# Patient Record
Sex: Female | Born: 1965 | Race: White | Hispanic: Yes | Marital: Married | Smoking: Never smoker
Health system: Southern US, Community
[De-identification: ages and names within clinical notes are randomized; demographics above are authoritative.]

## PROBLEM LIST (undated history)

## (undated) DIAGNOSIS — Z800 Family history of malignant neoplasm of digestive organs: Secondary | ICD-10-CM

## (undated) DIAGNOSIS — Z853 Personal history of malignant neoplasm of breast: Secondary | ICD-10-CM

## (undated) DIAGNOSIS — E785 Hyperlipidemia, unspecified: Secondary | ICD-10-CM

## (undated) DIAGNOSIS — Z803 Family history of malignant neoplasm of breast: Secondary | ICD-10-CM

## (undated) DIAGNOSIS — C801 Malignant (primary) neoplasm, unspecified: Secondary | ICD-10-CM

## (undated) HISTORY — DX: Malignant (primary) neoplasm, unspecified: C80.1

## (undated) HISTORY — DX: Hyperlipidemia, unspecified: E78.5

## (undated) HISTORY — DX: Personal history of malignant neoplasm of breast: Z85.3

## (undated) HISTORY — DX: Family history of malignant neoplasm of digestive organs: Z80.0

## (undated) HISTORY — DX: Family history of malignant neoplasm of breast: Z80.3

## (undated) HISTORY — PX: MASTECTOMY: SHX3

---
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
## 1998-11-21 ENCOUNTER — Other Ambulatory Visit: Admission: RE | Admit: 1998-11-21 | Discharge: 1998-11-21 | Payer: Self-pay | Admitting: Obstetrics and Gynecology

## 1999-12-03 ENCOUNTER — Other Ambulatory Visit: Admission: RE | Admit: 1999-12-03 | Discharge: 1999-12-03 | Payer: Self-pay | Admitting: Obstetrics and Gynecology

## 2000-12-02 ENCOUNTER — Other Ambulatory Visit: Admission: RE | Admit: 2000-12-02 | Discharge: 2000-12-02 | Payer: Self-pay | Admitting: Obstetrics and Gynecology

## 2001-06-22 HISTORY — PX: APPENDECTOMY: SHX54

## 2001-11-16 ENCOUNTER — Encounter (INDEPENDENT_AMBULATORY_CARE_PROVIDER_SITE_OTHER): Payer: Self-pay | Admitting: Specialist

## 2001-11-16 ENCOUNTER — Encounter: Payer: Self-pay | Admitting: Emergency Medicine

## 2001-11-17 ENCOUNTER — Inpatient Hospital Stay (HOSPITAL_COMMUNITY): Admission: EM | Admit: 2001-11-17 | Discharge: 2001-11-17 | Payer: Self-pay | Admitting: Emergency Medicine

## 2001-12-12 ENCOUNTER — Other Ambulatory Visit: Admission: RE | Admit: 2001-12-12 | Discharge: 2001-12-12 | Payer: Self-pay | Admitting: Obstetrics and Gynecology

## 2002-01-06 ENCOUNTER — Encounter: Payer: Self-pay | Admitting: Orthopedic Surgery

## 2002-01-06 ENCOUNTER — Ambulatory Visit (HOSPITAL_COMMUNITY): Admission: RE | Admit: 2002-01-06 | Discharge: 2002-01-06 | Payer: Self-pay | Admitting: Orthopedic Surgery

## 2002-07-05 ENCOUNTER — Other Ambulatory Visit: Admission: RE | Admit: 2002-07-05 | Discharge: 2002-07-05 | Payer: Self-pay | Admitting: Obstetrics and Gynecology

## 2002-12-11 ENCOUNTER — Inpatient Hospital Stay (HOSPITAL_COMMUNITY): Admission: AD | Admit: 2002-12-11 | Discharge: 2002-12-11 | Payer: Self-pay | Admitting: Obstetrics and Gynecology

## 2002-12-27 ENCOUNTER — Inpatient Hospital Stay (HOSPITAL_COMMUNITY): Admission: AD | Admit: 2002-12-27 | Discharge: 2002-12-30 | Payer: Self-pay | Admitting: Obstetrics and Gynecology

## 2002-12-31 ENCOUNTER — Inpatient Hospital Stay (HOSPITAL_COMMUNITY): Admission: AD | Admit: 2002-12-31 | Discharge: 2002-12-31 | Payer: Self-pay | Admitting: *Deleted

## 2003-02-27 ENCOUNTER — Other Ambulatory Visit: Admission: RE | Admit: 2003-02-27 | Discharge: 2003-02-27 | Payer: Self-pay | Admitting: Obstetrics and Gynecology

## 2004-05-26 ENCOUNTER — Encounter: Admission: RE | Admit: 2004-05-26 | Discharge: 2004-08-24 | Payer: Self-pay | Admitting: Obstetrics and Gynecology

## 2005-10-01 ENCOUNTER — Encounter: Admission: RE | Admit: 2005-10-01 | Discharge: 2005-10-01 | Payer: Self-pay | Admitting: Obstetrics and Gynecology

## 2005-10-01 ENCOUNTER — Encounter (INDEPENDENT_AMBULATORY_CARE_PROVIDER_SITE_OTHER): Payer: Self-pay | Admitting: Diagnostic Radiology

## 2005-10-01 ENCOUNTER — Encounter (INDEPENDENT_AMBULATORY_CARE_PROVIDER_SITE_OTHER): Payer: Self-pay | Admitting: *Deleted

## 2005-10-07 ENCOUNTER — Encounter: Admission: RE | Admit: 2005-10-07 | Discharge: 2005-10-07 | Payer: Self-pay | Admitting: General Surgery

## 2005-10-08 ENCOUNTER — Encounter: Admission: RE | Admit: 2005-10-08 | Discharge: 2005-10-08 | Payer: Self-pay | Admitting: Women's Health

## 2005-10-15 ENCOUNTER — Encounter (INDEPENDENT_AMBULATORY_CARE_PROVIDER_SITE_OTHER): Payer: Self-pay | Admitting: Specialist

## 2005-10-15 ENCOUNTER — Encounter: Admission: RE | Admit: 2005-10-15 | Discharge: 2005-10-15 | Payer: Self-pay | Admitting: General Surgery

## 2005-10-21 ENCOUNTER — Encounter: Payer: Self-pay | Admitting: Emergency Medicine

## 2005-10-29 ENCOUNTER — Ambulatory Visit: Payer: Self-pay | Admitting: Oncology

## 2005-10-29 ENCOUNTER — Encounter: Admission: RE | Admit: 2005-10-29 | Discharge: 2005-10-29 | Payer: Self-pay | Admitting: General Surgery

## 2005-10-29 ENCOUNTER — Ambulatory Visit (HOSPITAL_BASED_OUTPATIENT_CLINIC_OR_DEPARTMENT_OTHER): Admission: RE | Admit: 2005-10-29 | Discharge: 2005-10-29 | Payer: Self-pay | Admitting: General Surgery

## 2005-10-29 ENCOUNTER — Encounter (INDEPENDENT_AMBULATORY_CARE_PROVIDER_SITE_OTHER): Payer: Self-pay | Admitting: *Deleted

## 2005-11-11 ENCOUNTER — Encounter: Payer: Self-pay | Admitting: Family Medicine

## 2005-11-11 LAB — CBC WITH DIFFERENTIAL/PLATELET
Basophils Absolute: 0 10*3/uL (ref 0.0–0.1)
EOS%: 1.3 % (ref 0.0–7.0)
HCT: 39.3 % (ref 34.8–46.6)
HGB: 13.6 g/dL (ref 11.6–15.9)
LYMPH%: 20 % (ref 14.0–48.0)
MCH: 31.1 pg (ref 26.0–34.0)
MCHC: 34.6 g/dL (ref 32.0–36.0)
MCV: 89.8 fL (ref 81.0–101.0)
MONO%: 6.1 % (ref 0.0–13.0)
NEUT%: 72.4 % (ref 39.6–76.8)
Platelets: 252 10*3/uL (ref 145–400)
lymph#: 1 10*3/uL (ref 0.9–3.3)

## 2005-11-11 LAB — COMPREHENSIVE METABOLIC PANEL
ALT: 14 U/L (ref 0–40)
CO2: 28 mEq/L (ref 19–32)
Creatinine, Ser: 0.9 mg/dL (ref 0.4–1.2)
Total Bilirubin: 0.6 mg/dL (ref 0.3–1.2)

## 2005-11-11 LAB — LACTATE DEHYDROGENASE: LDH: 135 U/L (ref 94–250)

## 2005-11-17 ENCOUNTER — Encounter (INDEPENDENT_AMBULATORY_CARE_PROVIDER_SITE_OTHER): Payer: Self-pay | Admitting: *Deleted

## 2005-11-17 ENCOUNTER — Ambulatory Visit (HOSPITAL_BASED_OUTPATIENT_CLINIC_OR_DEPARTMENT_OTHER): Admission: RE | Admit: 2005-11-17 | Discharge: 2005-11-17 | Payer: Self-pay | Admitting: General Surgery

## 2005-11-25 ENCOUNTER — Ambulatory Visit (HOSPITAL_COMMUNITY): Admission: RE | Admit: 2005-11-25 | Discharge: 2005-11-25 | Payer: Self-pay | Admitting: Oncology

## 2005-12-02 ENCOUNTER — Encounter (INDEPENDENT_AMBULATORY_CARE_PROVIDER_SITE_OTHER): Payer: Self-pay | Admitting: Specialist

## 2005-12-02 ENCOUNTER — Ambulatory Visit (HOSPITAL_BASED_OUTPATIENT_CLINIC_OR_DEPARTMENT_OTHER): Admission: RE | Admit: 2005-12-02 | Discharge: 2005-12-03 | Payer: Self-pay | Admitting: General Surgery

## 2005-12-09 ENCOUNTER — Ambulatory Visit: Payer: Self-pay | Admitting: Oncology

## 2005-12-16 ENCOUNTER — Encounter: Admission: RE | Admit: 2005-12-16 | Discharge: 2005-12-16 | Payer: Self-pay | Admitting: Oncology

## 2006-01-06 LAB — CBC WITH DIFFERENTIAL/PLATELET
BASO%: 0.3 % (ref 0.0–2.0)
Eosinophils Absolute: 0.2 10*3/uL (ref 0.0–0.5)
LYMPH%: 19.8 % (ref 14.0–48.0)
MCHC: 34.6 g/dL (ref 32.0–36.0)
MCV: 90.1 fL (ref 81.0–101.0)
MONO#: 0.4 10*3/uL (ref 0.1–0.9)
MONO%: 8.6 % (ref 0.0–13.0)
NEUT#: 3 10*3/uL (ref 1.5–6.5)
Platelets: 242 10*3/uL (ref 145–400)
RBC: 3.99 10*6/uL (ref 3.70–5.32)
RDW: 12.3 % (ref 11.3–14.5)
WBC: 4.4 10*3/uL (ref 3.9–10.0)

## 2006-01-06 LAB — BASIC METABOLIC PANEL
Calcium: 9 mg/dL (ref 8.4–10.5)
Glucose, Bld: 82 mg/dL (ref 70–99)
Potassium: 4.1 mEq/L (ref 3.5–5.3)
Sodium: 139 mEq/L (ref 135–145)

## 2006-01-14 LAB — CBC WITH DIFFERENTIAL/PLATELET
Basophils Absolute: 0.5 10*3/uL — ABNORMAL HIGH (ref 0.0–0.1)
Eosinophils Absolute: 0.1 10*3/uL (ref 0.0–0.5)
HCT: 40.5 % (ref 34.8–46.6)
HGB: 14.3 g/dL (ref 11.6–15.9)
LYMPH%: 7.2 % — ABNORMAL LOW (ref 14.0–48.0)
MONO#: 1.1 10*3/uL — ABNORMAL HIGH (ref 0.1–0.9)
NEUT%: 86.4 % — ABNORMAL HIGH (ref 39.6–76.8)
Platelets: 230 10*3/uL (ref 145–400)
WBC: 26.5 10*3/uL — ABNORMAL HIGH (ref 3.9–10.0)
lymph#: 1.9 10*3/uL (ref 0.9–3.3)

## 2006-01-25 ENCOUNTER — Ambulatory Visit: Payer: Self-pay | Admitting: Oncology

## 2006-01-27 LAB — COMPREHENSIVE METABOLIC PANEL
ALT: 38 U/L (ref 0–40)
Albumin: 4.7 g/dL (ref 3.5–5.2)
CO2: 26 mEq/L (ref 19–32)
Glucose, Bld: 139 mg/dL — ABNORMAL HIGH (ref 70–99)
Potassium: 4.3 mEq/L (ref 3.5–5.3)
Sodium: 139 mEq/L (ref 135–145)
Total Protein: 7 g/dL (ref 6.0–8.3)

## 2006-01-27 LAB — CBC WITH DIFFERENTIAL/PLATELET
Basophils Absolute: 0 10*3/uL (ref 0.0–0.1)
HCT: 37 % (ref 34.8–46.6)
HGB: 12.8 g/dL (ref 11.6–15.9)
MCH: 31.2 pg (ref 26.0–34.0)
MONO#: 0 10*3/uL — ABNORMAL LOW (ref 0.1–0.9)
NEUT%: 95.7 % — ABNORMAL HIGH (ref 39.6–76.8)
WBC: 8.1 10*3/uL (ref 3.9–10.0)
lymph#: 0.3 10*3/uL — ABNORMAL LOW (ref 0.9–3.3)

## 2006-02-04 LAB — CBC WITH DIFFERENTIAL/PLATELET
BASO%: 1.1 % (ref 0.0–2.0)
Eosinophils Absolute: 0 10*3/uL (ref 0.0–0.5)
MCHC: 34.8 g/dL (ref 32.0–36.0)
MCV: 88.5 fL (ref 81.0–101.0)
MONO#: 0.4 10*3/uL (ref 0.1–0.9)
MONO%: 11.7 % (ref 0.0–13.0)
NEUT#: 2.2 10*3/uL (ref 1.5–6.5)
RBC: 4.11 10*6/uL (ref 3.70–5.32)
RDW: 13.1 % (ref 11.3–14.5)
WBC: 3.5 10*3/uL — ABNORMAL LOW (ref 3.9–10.0)

## 2006-02-17 LAB — CBC WITH DIFFERENTIAL/PLATELET
BASO%: 0.6 % (ref 0.0–2.0)
Eosinophils Absolute: 0 10*3/uL (ref 0.0–0.5)
LYMPH%: 4.6 % — ABNORMAL LOW (ref 14.0–48.0)
MCHC: 34 g/dL (ref 32.0–36.0)
MONO#: 0.1 10*3/uL (ref 0.1–0.9)
NEUT#: 5.4 10*3/uL (ref 1.5–6.5)
Platelets: 302 10*3/uL (ref 145–400)
RBC: 4.32 10*6/uL (ref 3.70–5.32)
WBC: 5.8 10*3/uL (ref 3.9–10.0)
lymph#: 0.3 10*3/uL — ABNORMAL LOW (ref 0.9–3.3)

## 2006-02-25 LAB — CBC WITH DIFFERENTIAL/PLATELET
BASO%: 2.6 % — ABNORMAL HIGH (ref 0.0–2.0)
Basophils Absolute: 0 10*3/uL (ref 0.0–0.1)
Eosinophils Absolute: 0 10*3/uL (ref 0.0–0.5)
HCT: 33.7 % — ABNORMAL LOW (ref 34.8–46.6)
HGB: 12 g/dL (ref 11.6–15.9)
LYMPH%: 38.7 % (ref 14.0–48.0)
MCHC: 35.4 g/dL (ref 32.0–36.0)
MONO#: 0.1 10*3/uL (ref 0.1–0.9)
NEUT#: 0.7 10*3/uL — ABNORMAL LOW (ref 1.5–6.5)
NEUT%: 47.5 % (ref 39.6–76.8)
Platelets: 231 10*3/uL (ref 145–400)
WBC: 1.4 10*3/uL — ABNORMAL LOW (ref 3.9–10.0)
lymph#: 0.5 10*3/uL — ABNORMAL LOW (ref 0.9–3.3)

## 2006-03-16 ENCOUNTER — Ambulatory Visit: Payer: Self-pay | Admitting: Oncology

## 2006-03-18 LAB — CBC WITH DIFFERENTIAL/PLATELET
BASO%: 0.2 % (ref 0.0–2.0)
Eosinophils Absolute: 0 10*3/uL (ref 0.0–0.5)
HCT: 37.8 % (ref 34.8–46.6)
MCHC: 34.9 g/dL (ref 32.0–36.0)
MONO#: 0.5 10*3/uL (ref 0.1–0.9)
NEUT#: 6 10*3/uL (ref 1.5–6.5)
NEUT%: 85.4 % — ABNORMAL HIGH (ref 39.6–76.8)
Platelets: 285 10*3/uL (ref 145–400)
RBC: 4.34 10*6/uL (ref 3.70–5.32)
WBC: 7 10*3/uL (ref 3.9–10.0)
lymph#: 0.5 10*3/uL — ABNORMAL LOW (ref 0.9–3.3)

## 2006-03-18 LAB — COMPREHENSIVE METABOLIC PANEL
ALT: 27 U/L (ref 0–40)
BUN: 16 mg/dL (ref 6–23)
CO2: 23 mEq/L (ref 19–32)
Calcium: 9.2 mg/dL (ref 8.4–10.5)
Chloride: 105 mEq/L (ref 96–112)
Creatinine, Ser: 0.75 mg/dL (ref 0.40–1.20)
Glucose, Bld: 115 mg/dL — ABNORMAL HIGH (ref 70–99)
Total Bilirubin: 0.4 mg/dL (ref 0.3–1.2)

## 2006-03-26 LAB — CBC WITH DIFFERENTIAL/PLATELET
BASO%: 3 % — ABNORMAL HIGH (ref 0.0–2.0)
LYMPH%: 56.5 % — ABNORMAL HIGH (ref 14.0–48.0)
MCHC: 36.2 g/dL — ABNORMAL HIGH (ref 32.0–36.0)
MONO#: 0.1 10*3/uL (ref 0.1–0.9)
RBC: 4.11 10*6/uL (ref 3.70–5.32)
RDW: 11.3 % (ref 11.3–14.5)
WBC: 0.8 10*3/uL — CL (ref 3.9–10.0)
lymph#: 0.4 10*3/uL — ABNORMAL LOW (ref 0.9–3.3)

## 2006-04-02 LAB — CBC WITH DIFFERENTIAL/PLATELET
BASO%: 3.1 % — ABNORMAL HIGH (ref 0.0–2.0)
HCT: 39.1 % (ref 34.8–46.6)
MCHC: 35.1 g/dL (ref 32.0–36.0)
MONO#: 0.6 10*3/uL (ref 0.1–0.9)
NEUT#: 1.5 10*3/uL (ref 1.5–6.5)
NEUT%: 51.4 % (ref 39.6–76.8)
WBC: 2.9 10*3/uL — ABNORMAL LOW (ref 3.9–10.0)
lymph#: 0.7 10*3/uL — ABNORMAL LOW (ref 0.9–3.3)

## 2006-04-23 LAB — CBC WITH DIFFERENTIAL/PLATELET
BASO%: 0.7 % (ref 0.0–2.0)
LYMPH%: 33.4 % (ref 14.0–48.0)
MCHC: 34.3 g/dL (ref 32.0–36.0)
MONO#: 0.3 10*3/uL (ref 0.1–0.9)
MONO%: 12.3 % (ref 0.0–13.0)
Platelets: 232 10*3/uL (ref 145–400)
RBC: 4.01 10*6/uL (ref 3.70–5.32)
RDW: 14.3 % (ref 11.3–14.5)
WBC: 2.2 10*3/uL — ABNORMAL LOW (ref 3.9–10.0)

## 2006-04-23 LAB — COMPREHENSIVE METABOLIC PANEL
ALT: 12 U/L (ref 0–35)
Alkaline Phosphatase: 42 U/L (ref 39–117)
CO2: 29 mEq/L (ref 19–32)
Potassium: 4.4 mEq/L (ref 3.5–5.3)
Sodium: 140 mEq/L (ref 135–145)
Total Bilirubin: 0.5 mg/dL (ref 0.3–1.2)
Total Protein: 6 g/dL (ref 6.0–8.3)

## 2006-04-28 LAB — CBC WITH DIFFERENTIAL/PLATELET
Basophils Absolute: 0.1 10*3/uL (ref 0.0–0.1)
EOS%: 1.7 % (ref 0.0–7.0)
Eosinophils Absolute: 0.1 10*3/uL (ref 0.0–0.5)
HGB: 13 g/dL (ref 11.6–15.9)
LYMPH%: 24.2 % (ref 14.0–48.0)
MCH: 30.7 pg (ref 26.0–34.0)
MCV: 87.5 fL (ref 81.0–101.0)
MONO%: 11.1 % (ref 0.0–13.0)
NEUT#: 1.9 10*3/uL (ref 1.5–6.5)
Platelets: 212 10*3/uL (ref 145–400)
RBC: 4.23 10*6/uL (ref 3.70–5.32)

## 2006-05-05 ENCOUNTER — Ambulatory Visit: Payer: Self-pay | Admitting: Oncology

## 2006-05-07 LAB — CBC WITH DIFFERENTIAL/PLATELET
BASO%: 1.6 % (ref 0.0–2.0)
EOS%: 1.8 % (ref 0.0–7.0)
LYMPH%: 32.1 % (ref 14.0–48.0)
MCHC: 34.9 g/dL (ref 32.0–36.0)
MCV: 89.3 fL (ref 81.0–101.0)
MONO%: 9.8 % (ref 0.0–13.0)
Platelets: 201 10*3/uL (ref 145–400)
RBC: 4.41 10*6/uL (ref 3.70–5.32)

## 2006-05-10 ENCOUNTER — Ambulatory Visit (HOSPITAL_BASED_OUTPATIENT_CLINIC_OR_DEPARTMENT_OTHER): Admission: RE | Admit: 2006-05-10 | Discharge: 2006-05-10 | Payer: Self-pay | Admitting: *Deleted

## 2006-05-28 LAB — CBC WITH DIFFERENTIAL/PLATELET
BASO%: 2.8 % — ABNORMAL HIGH (ref 0.0–2.0)
EOS%: 1.7 % (ref 0.0–7.0)
Eosinophils Absolute: 0.1 10*3/uL (ref 0.0–0.5)
LYMPH%: 17.4 % (ref 14.0–48.0)
MCH: 30.4 pg (ref 26.0–34.0)
MCHC: 34.4 g/dL (ref 32.0–36.0)
MCV: 88.3 fL (ref 81.0–101.0)
MONO%: 15.1 % — ABNORMAL HIGH (ref 0.0–13.0)
Platelets: 216 10*3/uL (ref 145–400)
RBC: 4.47 10*6/uL (ref 3.70–5.32)
RDW: 11.4 % (ref 11.3–14.5)

## 2006-07-20 ENCOUNTER — Ambulatory Visit: Payer: Self-pay | Admitting: Oncology

## 2006-07-23 LAB — COMPREHENSIVE METABOLIC PANEL
BUN: 15 mg/dL (ref 6–23)
CO2: 27 mEq/L (ref 19–32)
Calcium: 9 mg/dL (ref 8.4–10.5)
Chloride: 106 mEq/L (ref 96–112)
Creatinine, Ser: 0.96 mg/dL (ref 0.40–1.20)

## 2006-07-23 LAB — CBC WITH DIFFERENTIAL/PLATELET
Basophils Absolute: 0 10*3/uL (ref 0.0–0.1)
Eosinophils Absolute: 0 10*3/uL (ref 0.0–0.5)
HCT: 39 % (ref 34.8–46.6)
HGB: 13.8 g/dL (ref 11.6–15.9)
MONO#: 0.3 10*3/uL (ref 0.1–0.9)
NEUT%: 70.2 % (ref 39.6–76.8)
WBC: 3.4 10*3/uL — ABNORMAL LOW (ref 3.9–10.0)
lymph#: 0.7 10*3/uL — ABNORMAL LOW (ref 0.9–3.3)

## 2006-07-23 LAB — CANCER ANTIGEN 27.29: CA 27.29: 18 U/mL (ref 0–39)

## 2006-10-07 ENCOUNTER — Encounter: Admission: RE | Admit: 2006-10-07 | Discharge: 2006-10-07 | Payer: Self-pay | Admitting: General Surgery

## 2006-10-18 ENCOUNTER — Ambulatory Visit (HOSPITAL_COMMUNITY): Admission: RE | Admit: 2006-10-18 | Discharge: 2006-10-18 | Payer: Self-pay | Admitting: Oncology

## 2006-10-19 ENCOUNTER — Ambulatory Visit: Payer: Self-pay | Admitting: Oncology

## 2006-10-22 LAB — CBC WITH DIFFERENTIAL/PLATELET
Basophils Absolute: 0 10*3/uL (ref 0.0–0.1)
EOS%: 0.4 % (ref 0.0–7.0)
Eosinophils Absolute: 0 10*3/uL (ref 0.0–0.5)
HGB: 13.4 g/dL (ref 11.6–15.9)
MCH: 31.6 pg (ref 26.0–34.0)
NEUT#: 2.4 10*3/uL (ref 1.5–6.5)
RDW: 12 % (ref 11.3–14.5)
lymph#: 1 10*3/uL (ref 0.9–3.3)

## 2006-10-22 LAB — COMPREHENSIVE METABOLIC PANEL
ALT: 14 U/L (ref 0–35)
AST: 20 U/L (ref 0–37)
Alkaline Phosphatase: 45 U/L (ref 39–117)
Creatinine, Ser: 0.91 mg/dL (ref 0.40–1.20)
Sodium: 142 mEq/L (ref 135–145)
Total Bilirubin: 0.5 mg/dL (ref 0.3–1.2)
Total Protein: 6.3 g/dL (ref 6.0–8.3)

## 2006-10-22 LAB — LACTATE DEHYDROGENASE: LDH: 150 U/L (ref 94–250)

## 2006-10-23 ENCOUNTER — Ambulatory Visit (HOSPITAL_COMMUNITY): Admission: RE | Admit: 2006-10-23 | Discharge: 2006-10-23 | Payer: Self-pay | Admitting: Oncology

## 2007-02-23 ENCOUNTER — Ambulatory Visit: Payer: Self-pay | Admitting: Oncology

## 2007-02-25 LAB — CBC WITH DIFFERENTIAL/PLATELET
Basophils Absolute: 0 10*3/uL (ref 0.0–0.1)
Eosinophils Absolute: 0 10*3/uL (ref 0.0–0.5)
HCT: 36 % (ref 34.8–46.6)
HGB: 12.9 g/dL (ref 11.6–15.9)
MCV: 89.3 fL (ref 81.0–101.0)
MONO%: 8.6 % (ref 0.0–13.0)
NEUT#: 2.5 10*3/uL (ref 1.5–6.5)
NEUT%: 66.1 % (ref 39.6–76.8)
Platelets: 199 10*3/uL (ref 145–400)
RDW: 12.3 % (ref 11.3–14.5)

## 2007-02-25 LAB — COMPREHENSIVE METABOLIC PANEL
Albumin: 4.3 g/dL (ref 3.5–5.2)
Alkaline Phosphatase: 41 U/L (ref 39–117)
BUN: 17 mg/dL (ref 6–23)
Calcium: 8.8 mg/dL (ref 8.4–10.5)
Creatinine, Ser: 0.96 mg/dL (ref 0.40–1.20)
Glucose, Bld: 79 mg/dL (ref 70–99)
Potassium: 4 mEq/L (ref 3.5–5.3)

## 2007-03-31 ENCOUNTER — Encounter: Admission: RE | Admit: 2007-03-31 | Discharge: 2007-03-31 | Payer: Self-pay | Admitting: Surgery

## 2007-04-15 ENCOUNTER — Encounter: Admission: RE | Admit: 2007-04-15 | Discharge: 2007-04-15 | Payer: Self-pay | Admitting: Oncology

## 2007-06-22 ENCOUNTER — Ambulatory Visit: Payer: Self-pay | Admitting: Oncology

## 2007-07-08 LAB — CBC WITH DIFFERENTIAL/PLATELET
BASO%: 2.2 % — ABNORMAL HIGH (ref 0.0–2.0)
EOS%: 1.2 % (ref 0.0–7.0)
LYMPH%: 36.6 % (ref 14.0–48.0)
MCH: 30.8 pg (ref 26.0–34.0)
MCHC: 34.4 g/dL (ref 32.0–36.0)
MONO#: 0.2 10*3/uL (ref 0.1–0.9)
MONO%: 6.5 % (ref 0.0–13.0)
NEUT%: 53.5 % (ref 39.6–76.8)
Platelets: 216 10*3/uL (ref 145–400)
RBC: 4.43 10*6/uL (ref 3.70–5.32)
WBC: 3.1 10*3/uL — ABNORMAL LOW (ref 3.9–10.0)

## 2007-07-08 LAB — COMPREHENSIVE METABOLIC PANEL
ALT: 13 U/L (ref 0–35)
AST: 23 U/L (ref 0–37)
Alkaline Phosphatase: 47 U/L (ref 39–117)
CO2: 27 mEq/L (ref 19–32)
Creatinine, Ser: 0.98 mg/dL (ref 0.40–1.20)
Sodium: 141 mEq/L (ref 135–145)
Total Bilirubin: 0.5 mg/dL (ref 0.3–1.2)
Total Protein: 6.8 g/dL (ref 6.0–8.3)

## 2007-11-02 ENCOUNTER — Ambulatory Visit: Payer: Self-pay | Admitting: Oncology

## 2007-11-04 LAB — CBC WITH DIFFERENTIAL/PLATELET
BASO%: 0.4 % (ref 0.0–2.0)
Basophils Absolute: 0 10*3/uL (ref 0.0–0.1)
EOS%: 0.8 % (ref 0.0–7.0)
Eosinophils Absolute: 0 10*3/uL (ref 0.0–0.5)
HCT: 37.5 % (ref 34.8–46.6)
HGB: 13.2 g/dL (ref 11.6–15.9)
LYMPH%: 36.3 % (ref 14.0–48.0)
MCH: 31.2 pg (ref 26.0–34.0)
MCHC: 35.1 g/dL (ref 32.0–36.0)
MCV: 88.8 fL (ref 81.0–101.0)
MONO#: 0.3 10*3/uL (ref 0.1–0.9)
MONO%: 9 % (ref 0.0–13.0)
NEUT#: 1.9 10*3/uL (ref 1.5–6.5)
NEUT%: 53.5 % (ref 39.6–76.8)
Platelets: 210 10*3/uL (ref 145–400)
RBC: 4.22 10*6/uL (ref 3.70–5.32)
RDW: 11.8 % (ref 11.3–14.5)
WBC: 3.5 10*3/uL — ABNORMAL LOW (ref 3.9–10.0)
lymph#: 1.3 10*3/uL (ref 0.9–3.3)

## 2007-11-04 LAB — COMPREHENSIVE METABOLIC PANEL
Alkaline Phosphatase: 50 U/L (ref 39–117)
CO2: 25 mEq/L (ref 19–32)
Creatinine, Ser: 0.95 mg/dL (ref 0.40–1.20)
Glucose, Bld: 77 mg/dL (ref 70–99)
Sodium: 141 mEq/L (ref 135–145)
Total Bilirubin: 0.4 mg/dL (ref 0.3–1.2)

## 2007-11-04 LAB — LACTATE DEHYDROGENASE: LDH: 166 U/L (ref 94–250)

## 2007-12-28 ENCOUNTER — Encounter: Admission: RE | Admit: 2007-12-28 | Discharge: 2007-12-28 | Payer: Self-pay | Admitting: Sports Medicine

## 2008-04-09 ENCOUNTER — Encounter: Admission: RE | Admit: 2008-04-09 | Discharge: 2008-04-09 | Payer: Self-pay | Admitting: Oncology

## 2008-04-18 ENCOUNTER — Encounter: Admission: RE | Admit: 2008-04-18 | Discharge: 2008-04-18 | Payer: Self-pay | Admitting: Oncology

## 2008-05-18 ENCOUNTER — Ambulatory Visit: Payer: Self-pay | Admitting: Oncology

## 2008-05-22 LAB — CBC WITH DIFFERENTIAL/PLATELET
BASO%: 0.5 % (ref 0.0–2.0)
LYMPH%: 37 % (ref 14.0–48.0)
MCHC: 35.2 g/dL (ref 32.0–36.0)
MONO#: 0.3 10*3/uL (ref 0.1–0.9)
Platelets: 215 10*3/uL (ref 145–400)
RBC: 4.24 10*6/uL (ref 3.70–5.32)
WBC: 3.4 10*3/uL — ABNORMAL LOW (ref 3.9–10.0)

## 2008-05-22 LAB — FOLLICLE STIMULATING HORMONE: FSH: 44.1 m[IU]/mL

## 2008-05-23 LAB — COMPREHENSIVE METABOLIC PANEL
ALT: 16 U/L (ref 0–35)
Alkaline Phosphatase: 54 U/L (ref 39–117)
CO2: 26 mEq/L (ref 19–32)
Sodium: 140 mEq/L (ref 135–145)
Total Bilirubin: 0.6 mg/dL (ref 0.3–1.2)
Total Protein: 7 g/dL (ref 6.0–8.3)

## 2008-10-02 ENCOUNTER — Encounter: Admission: RE | Admit: 2008-10-02 | Discharge: 2008-10-02 | Payer: Self-pay | Admitting: Surgery

## 2008-10-09 ENCOUNTER — Encounter: Admission: RE | Admit: 2008-10-09 | Discharge: 2008-10-09 | Payer: Self-pay | Admitting: Surgery

## 2008-10-17 LAB — HM MAMMOGRAPHY: HM Mammogram: NORMAL

## 2008-10-25 ENCOUNTER — Ambulatory Visit: Payer: Self-pay | Admitting: Family Medicine

## 2008-10-25 DIAGNOSIS — C801 Malignant (primary) neoplasm, unspecified: Secondary | ICD-10-CM

## 2008-10-25 DIAGNOSIS — E785 Hyperlipidemia, unspecified: Secondary | ICD-10-CM

## 2008-10-25 DIAGNOSIS — Z853 Personal history of malignant neoplasm of breast: Secondary | ICD-10-CM | POA: Insufficient documentation

## 2008-10-25 HISTORY — DX: Hyperlipidemia, unspecified: E78.5

## 2008-10-25 HISTORY — DX: Malignant (primary) neoplasm, unspecified: C80.1

## 2008-10-25 HISTORY — DX: Personal history of malignant neoplasm of breast: Z85.3

## 2008-10-26 LAB — CONVERTED CEMR LAB
ALT: 19 units/L (ref 0–35)
AST: 29 units/L (ref 0–37)
Albumin: 4.1 g/dL (ref 3.5–5.2)
Alkaline Phosphatase: 41 units/L (ref 39–117)
Basophils Relative: 0.8 % (ref 0.0–3.0)
Bilirubin, Direct: 0 mg/dL (ref 0.0–0.3)
Calcium: 9 mg/dL (ref 8.4–10.5)
Creatinine, Ser: 1 mg/dL (ref 0.4–1.2)
Eosinophils Absolute: 0 10*3/uL (ref 0.0–0.7)
Eosinophils Relative: 0.5 % (ref 0.0–5.0)
GFR calc non Af Amer: 64.09 mL/min (ref >60)
HDL: 39.3 mg/dL (ref >39.00)
Hemoglobin: 13.2 g/dL (ref 12.0–15.0)
LDL Cholesterol: 113 mg/dL — ABNORMAL HIGH (ref 0–99)
Lymphocytes Relative: 22.6 % (ref 12.0–46.0)
Monocytes Relative: 6.1 % (ref 3.0–12.0)
Neutro Abs: 4.2 10*3/uL (ref 1.4–7.7)
Neutrophils Relative %: 70 % (ref 43.0–77.0)
RBC: 4.08 M/uL (ref 3.87–5.11)
Sodium: 145 meq/L (ref 135–145)
Total CHOL/HDL Ratio: 4
Total Protein: 6.7 g/dL (ref 6.0–8.3)
Triglycerides: 81 mg/dL (ref 0.0–149.0)
WBC: 6 10*3/uL (ref 4.5–10.5)

## 2008-11-08 ENCOUNTER — Ambulatory Visit: Payer: Self-pay | Admitting: Family Medicine

## 2008-11-20 ENCOUNTER — Ambulatory Visit: Payer: Self-pay | Admitting: Oncology

## 2008-11-29 LAB — CBC WITH DIFFERENTIAL/PLATELET
BASO%: 0.4 % (ref 0.0–2.0)
Basophils Absolute: 0 10*3/uL (ref 0.0–0.1)
EOS%: 0.8 % (ref 0.0–7.0)
Eosinophils Absolute: 0 10*3/uL (ref 0.0–0.5)
HCT: 37.9 % (ref 34.8–46.6)
HGB: 13.1 g/dL (ref 11.6–15.9)
LYMPH%: 31.3 % (ref 14.0–49.7)
MCH: 30.4 pg (ref 25.1–34.0)
MCHC: 34.6 g/dL (ref 31.5–36.0)
MCV: 87.9 fL (ref 79.5–101.0)
MONO#: 0.4 10*3/uL (ref 0.1–0.9)
MONO%: 8.1 % (ref 0.0–14.0)
NEUT#: 2.9 10*3/uL (ref 1.5–6.5)
NEUT%: 59.4 % (ref 38.4–76.8)
Platelets: 202 10*3/uL (ref 145–400)
RBC: 4.31 10*6/uL (ref 3.70–5.45)
RDW: 12.1 % (ref 11.2–14.5)
WBC: 4.8 10*3/uL (ref 3.9–10.3)
lymph#: 1.5 10*3/uL (ref 0.9–3.3)
nRBC: 0 % (ref 0–0)

## 2008-11-30 LAB — COMPREHENSIVE METABOLIC PANEL
Albumin: 4.2 g/dL (ref 3.5–5.2)
Alkaline Phosphatase: 41 U/L (ref 39–117)
BUN: 14 mg/dL (ref 6–23)
Creatinine, Ser: 0.99 mg/dL (ref 0.40–1.20)
Glucose, Bld: 99 mg/dL (ref 70–99)
Total Bilirubin: 0.4 mg/dL (ref 0.3–1.2)

## 2008-11-30 LAB — VITAMIN D 25 HYDROXY (VIT D DEFICIENCY, FRACTURES): Vit D, 25-Hydroxy: 45 ng/mL (ref 30–89)

## 2008-11-30 LAB — FOLLICLE STIMULATING HORMONE: FSH: 35.5 m[IU]/mL

## 2008-11-30 LAB — CANCER ANTIGEN 27.29: CA 27.29: 19 U/mL (ref 0–39)

## 2008-12-05 LAB — ESTRADIOL, ULTRA SENS

## 2009-04-16 ENCOUNTER — Encounter (INDEPENDENT_AMBULATORY_CARE_PROVIDER_SITE_OTHER): Payer: Self-pay | Admitting: Surgery

## 2009-04-16 ENCOUNTER — Ambulatory Visit (HOSPITAL_COMMUNITY): Admission: RE | Admit: 2009-04-16 | Discharge: 2009-04-17 | Payer: Self-pay | Admitting: Surgery

## 2009-05-23 ENCOUNTER — Encounter: Admission: RE | Admit: 2009-05-23 | Discharge: 2009-05-23 | Payer: Self-pay | Admitting: Obstetrics and Gynecology

## 2009-05-31 ENCOUNTER — Ambulatory Visit: Payer: Self-pay | Admitting: Oncology

## 2009-06-04 LAB — CBC WITH DIFFERENTIAL/PLATELET
BASO%: 0.5 % (ref 0.0–2.0)
LYMPH%: 28.8 % (ref 14.0–49.7)
MCHC: 34.3 g/dL (ref 31.5–36.0)
MONO#: 0.2 10*3/uL (ref 0.1–0.9)
RBC: 4.21 10*6/uL (ref 3.70–5.45)
WBC: 3.8 10*3/uL — ABNORMAL LOW (ref 3.9–10.3)
lymph#: 1.1 10*3/uL (ref 0.9–3.3)

## 2009-06-05 LAB — COMPREHENSIVE METABOLIC PANEL
ALT: 15 U/L (ref 0–35)
AST: 19 U/L (ref 0–37)
BUN: 13 mg/dL (ref 6–23)
CO2: 28 mEq/L (ref 19–32)
Creatinine, Ser: 0.91 mg/dL (ref 0.40–1.20)
Total Bilirubin: 0.5 mg/dL (ref 0.3–1.2)

## 2009-06-05 LAB — FOLLICLE STIMULATING HORMONE: FSH: 58.2 m[IU]/mL

## 2009-06-05 LAB — LACTATE DEHYDROGENASE: LDH: 169 U/L (ref 94–250)

## 2009-06-05 LAB — CANCER ANTIGEN 27.29: CA 27.29: 17 U/mL (ref 0–39)

## 2009-06-11 ENCOUNTER — Encounter: Payer: Self-pay | Admitting: Family Medicine

## 2009-06-18 LAB — ESTRADIOL, ULTRA SENS: Estradiol, Ultra Sensitive: 24 pg/mL

## 2009-12-04 ENCOUNTER — Ambulatory Visit: Payer: Self-pay | Admitting: Oncology

## 2009-12-06 LAB — COMPREHENSIVE METABOLIC PANEL
Alkaline Phosphatase: 38 U/L — ABNORMAL LOW (ref 39–117)
CO2: 23 mEq/L (ref 19–32)
Creatinine, Ser: 1.06 mg/dL (ref 0.40–1.20)
Glucose, Bld: 77 mg/dL (ref 70–99)
Total Bilirubin: 0.5 mg/dL (ref 0.3–1.2)

## 2009-12-06 LAB — CBC WITH DIFFERENTIAL/PLATELET
Basophils Absolute: 0 10*3/uL (ref 0.0–0.1)
EOS%: 0.3 % (ref 0.0–7.0)
Eosinophils Absolute: 0 10*3/uL (ref 0.0–0.5)
LYMPH%: 9.8 % — ABNORMAL LOW (ref 14.0–49.7)
MCH: 31.8 pg (ref 25.1–34.0)
MCV: 92.1 fL (ref 79.5–101.0)
MONO%: 4.2 % (ref 0.0–14.0)
NEUT#: 7.2 10*3/uL — ABNORMAL HIGH (ref 1.5–6.5)
Platelets: 186 10*3/uL (ref 145–400)
RBC: 4.05 10*6/uL (ref 3.70–5.45)

## 2009-12-06 LAB — LACTATE DEHYDROGENASE: LDH: 159 U/L (ref 94–250)

## 2009-12-06 LAB — CANCER ANTIGEN 27.29: CA 27.29: 17 U/mL (ref 0–39)

## 2009-12-06 LAB — FOLLICLE STIMULATING HORMONE: FSH: 11.6 m[IU]/mL

## 2009-12-19 LAB — ESTRADIOL, ULTRA SENS: Estradiol, Ultra Sensitive: 523 pg/mL

## 2010-05-30 ENCOUNTER — Ambulatory Visit: Payer: Self-pay | Admitting: Oncology

## 2010-06-03 LAB — CBC WITH DIFFERENTIAL/PLATELET
BASO%: 0.2 % (ref 0.0–2.0)
EOS%: 0.4 % (ref 0.0–7.0)
MCH: 31.8 pg (ref 25.1–34.0)
MCHC: 34.7 g/dL (ref 31.5–36.0)
RBC: 4.42 10*6/uL (ref 3.70–5.45)
RDW: 12.9 % (ref 11.2–14.5)
lymph#: 1.1 10*3/uL (ref 0.9–3.3)

## 2010-06-04 LAB — COMPREHENSIVE METABOLIC PANEL
ALT: 16 U/L (ref 0–35)
AST: 24 U/L (ref 0–37)
Albumin: 4.7 g/dL (ref 3.5–5.2)
Calcium: 9.2 mg/dL (ref 8.4–10.5)
Chloride: 106 mEq/L (ref 96–112)
Creatinine, Ser: 0.88 mg/dL (ref 0.40–1.20)
Potassium: 4.3 mEq/L (ref 3.5–5.3)
Sodium: 138 mEq/L (ref 135–145)

## 2010-06-04 LAB — VITAMIN D 25 HYDROXY (VIT D DEFICIENCY, FRACTURES): Vit D, 25-Hydroxy: 71 ng/mL (ref 30–89)

## 2010-06-05 ENCOUNTER — Ambulatory Visit (HOSPITAL_COMMUNITY)
Admission: RE | Admit: 2010-06-05 | Discharge: 2010-06-05 | Payer: Self-pay | Source: Home / Self Care | Attending: Oncology | Admitting: Oncology

## 2010-06-09 LAB — ESTRADIOL, ULTRA SENS: Estradiol, Ultra Sensitive: <2 pg/mL

## 2010-07-13 ENCOUNTER — Encounter: Payer: Self-pay | Admitting: General Surgery

## 2010-07-13 ENCOUNTER — Encounter: Payer: Self-pay | Admitting: Oncology

## 2010-09-12 ENCOUNTER — Other Ambulatory Visit (INDEPENDENT_AMBULATORY_CARE_PROVIDER_SITE_OTHER): Payer: 59 | Admitting: Family Medicine

## 2010-09-12 DIAGNOSIS — Z0000 Encounter for general adult medical examination without abnormal findings: Secondary | ICD-10-CM

## 2010-09-12 LAB — CBC WITH DIFFERENTIAL/PLATELET
Basophils Relative: 0.8 % (ref 0.0–3.0)
Eosinophils Relative: 1.1 % (ref 0.0–5.0)
HCT: 36.8 % (ref 36.0–46.0)
Hemoglobin: 12.6 g/dL (ref 12.0–15.0)
Lymphs Abs: 1 10*3/uL (ref 0.7–4.0)
Monocytes Relative: 7.9 % (ref 3.0–12.0)
Neutro Abs: 1.8 10*3/uL (ref 1.4–7.7)
RDW: 12.9 % (ref 11.5–14.6)

## 2010-09-12 LAB — TSH: TSH: 2.58 u[IU]/mL (ref 0.35–5.50)

## 2010-09-12 LAB — HEPATIC FUNCTION PANEL
ALT: 22 U/L (ref 0–35)
AST: 36 U/L (ref 0–37)
Albumin: 3.8 g/dL (ref 3.5–5.2)
Total Protein: 6.1 g/dL (ref 6.0–8.3)

## 2010-09-12 LAB — BASIC METABOLIC PANEL
GFR: 70.85 mL/min (ref >60.00)
Glucose, Bld: 81 mg/dL (ref 70–99)
Potassium: 4.8 mEq/L (ref 3.5–5.1)
Sodium: 143 mEq/L (ref 135–145)

## 2010-09-12 LAB — POCT URINALYSIS DIPSTICK
Glucose, UA: NEGATIVE
Ketones, UA: NEGATIVE
Leukocytes, UA: NEGATIVE
Spec Grav, UA: 1.02

## 2010-09-12 LAB — LIPID PANEL: Cholesterol: 158 mg/dL (ref 0–200)

## 2010-09-17 ENCOUNTER — Encounter: Payer: Self-pay | Admitting: Family Medicine

## 2010-09-19 ENCOUNTER — Encounter: Payer: Self-pay | Admitting: Family Medicine

## 2010-09-19 ENCOUNTER — Ambulatory Visit (INDEPENDENT_AMBULATORY_CARE_PROVIDER_SITE_OTHER): Payer: 59 | Admitting: Family Medicine

## 2010-09-19 VITALS — BP 102/72 | Temp 98.8°F | Ht 69.25 in | Wt 169.0 lb

## 2010-09-19 DIAGNOSIS — Z0000 Encounter for general adult medical examination without abnormal findings: Secondary | ICD-10-CM

## 2010-09-19 NOTE — Progress Notes (Signed)
  Subjective:    Patient ID: Kathryn [name], female    DOB: 06-Feb-1966, 45 y.o.   MRN: [id number]  HPI Patient seen for complete physical examination. Past medical history reviewed. History of mild hyperlipidemia. Breast cancer 2007. Status post bilateral mastectomies with implants. Continues to see oncologist regularly. She is nearing 5 year completion with tamoxifen.  Only complaint today is some bilateral maxillary sinus pressure off and on for the past couple of weeks. No postnasal drip. No purulent secretions. Denies fever or chills. Occasional mild headache relieved with over-the-counter medication. 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  Tetanus 2006. Continues to see gynecologist regularly. Exercises regularly. Family history and social history reviewed. Nonsmoker.   Review of Systems  Constitutional: Positive for appetite change. Negative for fever, activity change and fatigue.  HENT: Negative for hearing loss, ear pain, sore throat and trouble swallowing.   Eyes: Negative for visual disturbance.  Respiratory: Negative for cough and shortness of breath.   Cardiovascular: Negative for chest pain and palpitations.  Gastrointestinal: Negative for abdominal pain, diarrhea, constipation and blood in stool.  Genitourinary: Negative for dysuria and hematuria.  Musculoskeletal: Negative for myalgias, back pain and arthralgias.  Skin: Negative for rash.  Neurological: Negative for dizziness, syncope and headaches.  Hematological: Negative for adenopathy.  Psychiatric/Behavioral: Negative for confusion and dysphoric mood.       Objective:   Physical Exam  Constitutional: She is oriented to person, place, and time. She appears well-developed and well-nourished.  HENT:  Head: Normocephalic and atraumatic.  Eyes: EOM are normal. Pupils are equal, round, and reactive to light.  Neck: Normal range of motion. Neck supple. No thyromegaly present.  Cardiovascular: Normal rate,  regular rhythm and normal heart sounds.   No murmur heard. Pulmonary/Chest: Breath sounds normal. No respiratory distress. She has no wheezes. She has no rales.  Abdominal: Soft. Bowel sounds are normal. She exhibits no distension and no mass. There is no tenderness. There is no rebound and no guarding.  Musculoskeletal: Normal range of motion. She exhibits no edema.  Lymphadenopathy:    She has no cervical adenopathy.  Neurological: She is alert and oriented to person, place, and time. She displays normal reflexes. No cranial nerve deficit.  Skin: No rash noted.  Psychiatric: She has a normal mood and affect. Her behavior is normal. Judgment and thought content normal.          Assessment & Plan:  #1 complete physical. Labs reviewed with patient. Unremarkable. Tetanus up-to-date. Continue followup with oncologist and gynecologist. #2 probable seasonal allergies. Offered prescription for nasal steroid and she wishes to wait at this time. #3 history of breast cancer

## 2010-09-25 LAB — COMPREHENSIVE METABOLIC PANEL
ALT: 17 U/L (ref 0–35)
AST: 27 U/L (ref 0–37)
Alkaline Phosphatase: 44 U/L (ref 39–117)
CO2: 27 mEq/L (ref 19–32)
Glucose, Bld: 87 mg/dL (ref 70–99)
Potassium: 4.3 mEq/L (ref 3.5–5.1)
Sodium: 138 mEq/L (ref 135–145)
Total Protein: 6.2 g/dL (ref 6.0–8.3)

## 2010-09-25 LAB — URINALYSIS, ROUTINE W REFLEX MICROSCOPIC
Ketones, ur: NEGATIVE mg/dL
Nitrite: NEGATIVE
Protein, ur: NEGATIVE mg/dL
pH: 7 (ref 5.0–8.0)

## 2010-09-25 LAB — DIFFERENTIAL
Basophils Absolute: 0 10*3/uL (ref 0.0–0.1)
Eosinophils Relative: 1 % (ref 0–5)
Lymphocytes Relative: 26 % (ref 12–46)
Neutrophils Relative %: 66 % (ref 43–77)

## 2010-09-25 LAB — CBC
MCHC: 34.6 g/dL (ref 30.0–36.0)
Platelets: 207 10*3/uL (ref 150–400)
RBC: 4.21 MIL/uL (ref 3.87–5.11)
WBC: 6 10*3/uL (ref 4.0–10.5)

## 2010-11-07 NOTE — Op Note (Signed)
NAME:  Kathryn [name], Kathryn [name] NO.:  [id number]   MEDICAL RECORD NO.:  [id number]                   PATIENT TYPE:  INP   LOCATION:  9198                                 FACILITY:  [facility]   PHYSICIAN:  Maxie Better, M.D.            DATE OF BIRTH:  July 18, 1965   DATE OF PROCEDURE:  12/27/2002  DATE OF DISCHARGE:                                 OPERATIVE REPORT   PREOPERATIVE DIAGNOSIS:  Persistent breech presentation, term gestation.   POSTOPERATIVE DIAGNOSIS:  Incomplete breech presentation, term gestation.   PROCEDURE:  Primary cesarean section, low transverse uterine incision.   ANESTHESIA:  Spinal.   SURGEON:  Maxie Better, M.D.   ASSISTANT:  Gerri Spore B. Earlene Plater, M.D.   INDICATIONS FOR PROCEDURE:  This is a 45 year old gravida 1, para 0, female  at term with a persistent breech presentation who presents for primary  cesarean section.  The patient had failed external cephalic version attempt.  The risks and benefits of the procedure had been explained to the patient,  consent was signed, and the patient was transferred to the operating room.   PROCEDURE:  Under adequate spinal anesthesia, the patient was placed in a  supine position with a left lateral tilt.  She was sterilely prepped and  draped in the usual fashion.  An indwelling Foley catheter was sterilely  placed.  A marking pen was then utilized to outline the planned incision  line.  10 mL of 0.25% Marcaine was injected along the planned incision.  A  Pfannenstiel skin incision was then made, carried down to the rectus fascia  using Bovie cautery.  The rectus fascia was incised in the midline and  extended bilaterally.  The rectus fascia was then bluntly and with cautery  dissected off the rectus muscle in a superior and inferior fashion.  The  rectus muscle was split in the midline and the parietal peritoneum was  entered bluntly.  The vesicouterine peritoneum was then opened and  extended  transversely.  The bladder was then bluntly dissected off the lower uterine  segment and displaced inferiorly using a bladder retractor.  A curvilinear  low transverse uterine incision was then made and extended bilaterally using  bandage scissors.  An artificial rupture of membranes was performed.  Subsequent delivery of a live female from incomplete breech presentation was  accomplished in the usual breech maneuver.  The baby was bulb suctioned on  the abdomen, the cord was clamped, cut, and the baby was subsequently  transferred to the awaiting pediatricians who assigned Apgars of 9 and 9 at  1 and 5 minutes respectively.  The placenta was spontaneously intact, there  was noted to be a short cord.  The weight of the baby was 6 pounds 8 ounces.   The uterine cavity was explored, no intracavitary lesions were noted.  Normal tubes and ovaries were noted bilaterally.  The uterine incision was  closed in  two layers, the first layer 0 Monocryl running locked stitch, the  second layer was imbricated using 0 Monocryl sutures.  Two areas of bleeding  on the right were hemostased using figure-of-eight 0 Monocryl suture.  Small  bleeding along the peritoneal edges and the inferior aspect of the uterus  was cauterized.  The abdomen was copiously irrigated and suctioned at the  base.  Inspection of the incision showed good hemostasis.  The vesicouterine  peritoneum and the parietal peritoneum were not closed.  The under surface  of the rectus fascia was inspected.  Small bleeders were cauterized.  The  rectus muscle was inspected and no bleeders noted.  The rectus fascia was  closed with 0 Vicryl x 2.  The subcutaneous areas were irrigated and  suctioned.  Small bleeders were cauterized and the skin reapproximated using  Ethicon staples.  The specimen was placenta not sent.  Estimated blood loss  a liter.  Urine output 200 mL clear, yellow urine.  Interoperative fluid was  2300 mL  crystalloid.  Sponge and instrument counts x 2 were correct.  Complications were none.  The patient tolerated the procedure well and was  transferred to the recovery room in stable condition.                                               Maxie Better, M.D.    [place]/MEDQ  D:  12/27/2002  T:  12/27/2002  Job:  [id number]

## 2010-11-07 NOTE — Discharge Summary (Signed)
   [name], [name] NO.:  [id number]   MEDICAL RECORD NO.:  [id number]                   PATIENT TYPE:  INP   LOCATION:  9117                                 FACILITY:  [facility]   PHYSICIAN:  Maxie Better, M.D.            DATE OF BIRTH:  Dec 31, 1965   DATE OF ADMISSION:  12/27/2002  DATE OF DISCHARGE:  12/30/2002                                 DISCHARGE SUMMARY   ADMISSION DIAGNOSES:  1. Persistent breech presentation.  2. Term gestation.  3. Rh negative.   DISCHARGE DIAGNOSES:  1. Term gestation delivered.  2. Incomplete breech presentation.  3. Rh negative.  4. Postoperative anemia.   PROCEDURE:  Primary cesarean section via a __________  hysterotomy.   HISTORY OF PRESENT ILLNESS:  This is a 45 year old gravida 1, para 0 female  at term with a persistent breech presentation admitted for primary cesarean  section.  The patient had failed an attempt at external cephalic version at  37 weeks.  Her prenatal course had been otherwise unremarkable.  She is A  negative.  Group B strep culture was negative.   HOSPITAL COURSE:  The patient was admitted to [facility] she was taken  to the operating room where she underwent a primary cesarean section with  resultant delivery of a live female infant weighing 6 pounds 8 ounces,  normal tubes and ovaries were identified at the time of surgery, short cord  was noted, Apgars of 9 and 9.  The patient has had an uncomplicated  postoperative course.  By postoperative day #3 she was afebrile, the  incision was intact, and was deemed well to be discharged home.  On  postoperative day #2 with small amount of serosanguinous fluid noted at the  time.   DISPOSITION:  Home.   CONDITION ON DISCHARGE:  Stable.   DISCHARGE MEDICATIONS:  1. Tylox 1-2 tablets every 3-4 hours p.r.n. pain.  2. Motrin 600 mg 1 p.o. q.6h. p.r.n. pain.  3. Prenatal vitamins 1 p.o. daily.   FOLLOWUP APPOINTMENT:  Staple  removal in the office on 01/03/2003.   DISCHARGE INSTRUCTIONS:  1. Call for temperature greater to or equal to 100.4.  2. Nothing per vagina for 4-6 weeks.  3.     No heavy lifting and no driving for two weeks.  4. Call for increased incisional pain, redness or drainage around the     incision site, soaking a regular pad more frequently, severe abdominal     pain, nausea, vomiting, or passage of clots.                                               Maxie Better, M.D.    /MEDQ  D:  01/29/2003  T:  01/30/2003  Job:  [id number]

## 2010-11-07 NOTE — Op Note (Signed)
[facility]  Patient:    Kathryn [name], Kathryn [name] Visit Number: [id number] MRN: [id number]          Service Type: MED Location: [phone] Attending Physician:  Vikki Ports. Dictated by:   Earna Coder, M.D. Proc. Date: 11/17/01 Admit Date:  11/16/2001 Discharge Date: 11/17/2001                             Operative Report  PREOPERATIVE DIAGNOSIS:  Acute appendicitis.  POSTOPERATIVE DIAGNOSIS: 1. Acute appendicitis. 2. Right inguinal hernia.  OPERATION PERFORMED:  Laparoscopic appendectomy.  SURGEON:  Stephenie Acres, M.D.  ANESTHESIA:  General.  DESCRIPTION OF PROCEDURE:  The patient was taken to the operating room and placed in supine position.  After adequate general anesthesia was induced using endotracheal tube, the abdomen was prepped and draped in normal sterile fashion.  Using a transverse infraumbilical incision, I dissected down to the fascia.  The fascia was opened vertically.  A 0 Vicryl pursestring suture was placed around the fascial defect.  A Hasson trocar was placed in the abdomen and the abdomen was insufflated with carbon dioxide.  The patient was placed in the Trendelenburg position with the right side up.  Under direct visualization, a 12 mm blunt port was placed in the left superpubic region and a 5 mm port was placed in the right abdomen.  The appendix was easily visualized, was obviously suppurative, no evidence of perforation, no free fluid.  Near the base, the appendix was grasped and retracted anteriorly.  The mesoappendix was dissected off the base of the appendix and stapled using the vascular GIA stapling device.  The base of the appendix which was completely intact was then transected using a laparoscopic GIA stapling device as well. It was placed in an endocatch bag and removed through the umbilical port.  The cecum was involved with an indirect right inguinal hernia but could easily be reduced  from the inside.  Because of the nature of her appendicitis, I opted not to proceed with any surgical repair of the hernia at this point.  Adequate hemostasis was ensured and the abdomen was allowed to deflate.  Skin incisions were closed with subcuticular 4-0 Monocryl.  Steri-Strips and sterile dressings were applied.  The patient tolerated the procedure well and went to PACU in good condition. Dictated by:   Earna Coder, M.D. Attending Physician:  Danna Hefty R. DD:  11/17/01 TD:  11/17/01 Job: [id number]

## 2010-11-07 NOTE — Op Note (Signed)
Kathryn [name], Kathryn [name] NO.:  [id number]   MEDICAL RECORD NO.:  [id number]          PATIENT TYPE:  AMB   LOCATION:  NESC                         FACILITY:  [facility]   PHYSICIAN:  Lyman Speller, MD       DATE OF BIRTH:  [birth year]   DATE OF PROCEDURE:  05/10/2006  DATE OF DISCHARGE:                               OPERATIVE REPORT   SURGEON:  C.  Gerrie Nordmann, MD   PREOPERATIVE DIAGNOSES:  1. Surgical absence of the right breast, now status post full tissue      expansion.  2. Left breast ptosis.   POSTOPERATIVE DIAGNOSES:  1. Surgical absence of the right breast, now status post full tissue      expansion.  2. Left breast ptosis.   OPERATION PERFORMED:  1. Placement of permanent right breast implant.  2. Left modified Weiss pattern mastopexy.   ANESTHESIA:  General laryngeal mask.   DESCRIPTION OF OPERATION:  The patient was seen in the preoperative  holding area at which time the procedure was reviewed and the  preoperative markings were placed.  She offered no further questions.  She was then taken to the operating room, placed on the table in supine  position.  After induction of adequate general tracheal anesthesia the  chest was prepped and draped in the usual sterile fashion.  The  procedure was begun on the left breast with the mastopexy portion of the  procedure.  The nipple areolar complex was marked at 42 mm and the  breast pedicle at  8 cm.  A total of 18 mL of 1/2% Xylocaine with  epinephrine was then infused into the incision lines for hemostatic  purposes.  The breast pedicle was then incised sharply with a #15 blade.  The pedicle was then de-epithelialized using a #10 blade.  Meticulous  hemostasis was maintained throughout using the Bovie electrocautery.  At  this time the Bovie electrocautery was likewise used to remove the  portions of skin from the medial and lateral aspect of the breast.  Should be noted that the remaining breast incisions  were made with a #15  blade based on the modified Weiss pattern markings which had been placed  in the preoperative holding area.  At this time the corners of the  superior breast flaps were elevated with double prong skin hooks.  The  Bovie electrocautery was then used to elevate the superior breast flap.  This was continued back to the level of the anterior chest wall.  The  flap thickness was maintained at approximately 1.5 cm.  Again meticulous  hemostasis was maintained throughout using Bovie electrocautery.  The  breast was then irrigated and a final inspection made for hemostasis.  At this time the corners of the superior breast flaps were then  reapproximated to the midline of the inframammary incision using a  single buried interrupted suture of 3-0 Monocryl.  The remaining breast  incisions were then temporarily reapproximated with surgical steel  staples.  At this time the inframammary incision in the vertical breast  incision were closed in the  deep dermal aspect using multiple buried  interrupted sutures of 3-0 Monocryl.  The skin edge approximation was  then obtained along the inframammary incision with a running  subcuticular 4-0 Monocryl suture.  At this time the new nipple position  was delineated at 4.5 cm above the inframammary fold.  A 38 mm disk of  skin was marked for removal to facilitate nipple placement.  The skin  was incised with a #15 blade and then removed with the Bovie  electrocautery.  The nipple was then elevated into its new position and  sutured in the deep dermal aspect at the cardinal points using buried  interrupted sutures of 3-0 Monocryl.  The skin edge approximation of the  nipple-areolar complex was obtained with multiple buried interrupted  subcuticular 4-0 Monocryl sutures.  Skin edge approximation to the  vertical portion of the breast incision was obtained in like fashion.  The left breast was then cleansed.  The nipple was noted to be pink  and  viable.  At this time attention was turned to the tissue expanded right  chest.  A 6 cm incision was marked over the lateral most portion of the  previous mastectomy incision.  This was infiltrated with approximately 2  mL of 1/2% Xylocaine with epinephrine for hemostatic purposes.  A #15  blade was then used to perform the skin incision and the skin was  elevated off of the underlying pectoralis major muscle inferiorly over a  distance of approximately 3 cm.  The Bovie electrocautery was then used  to open the pectoralis major muscle.  The tissue expander was  identified.  A #15 blade was used to perforate the tissue expander and  the expander was then aspirated dry using the suction.  The expander was  removed without difficulty.  Inspection of the right subpectoral pocket  revealed very modest capsular formation with no evidence of recurrent  disease or tumor.  A modest capsulotomy was done medially along the  right sternal border to facilitate more medial placement of the implant.  No other capsulotomy was felt necessary.  At this time a breast implant  sizer was fashioned from a size eight glove and a IV extension tubing.  The sizer was placed into the breast and serially inflated.  It was felt  that the best size match was 400 mL as compared to her native left  breast.  The sizer was then removed and the right breast pocket was  thoroughly irrigated with antibiotic irrigation.  A final inspection was  made for hemostasis and all remaining bleeding controlled with Bovie  electrocautery.  The permanent breast implant was then brought onto the  field.  This was noted to be a Mentor moderate plus profile silicone gel  implant.  The implant volume was 400 mL.  The implant was soaked in  antibiotic irrigation and then placed into the right chest pocket.  The  patient was then rotated to semi-Fowler's position and inspection was made for size, shape and symmetry which were all felt to  be excellent.  The pectoralis major muscle was then reapproximated with multiple  interrupted sutures of 3-0 Monocryl.  The deep dermal aspect of the  wound was likewise reapproximated with buried interrupted sutures of 3-0  Monocryl.  Skin edge approximation for the right breast wound was  obtained with a running subcuticular 4-0 Monocryl suture.  No Steri-  Strips were placed as the patient has a history of previous allergy and  skin irritation  from adhesive tapes.  The wounds were all covered with  Xeroform strips and then a bulky moderately compressive chest dressing  was placed.  The final sponge and needle count was noted to be correct.  The estimated blood loss was minimal.  The patient was then awakened  from general anesthesia without difficulty and transported to the  recovery room in stable condition.  No complications were identified  throughout the course of the surgery.  She will be discharged to home  later today barring any unforeseen complications and will be scheduled  for office follow-up tomorrow.      Lyman Speller, MD  Electronically Signed     [facility]/MEDQ  D:  05/10/2006  T:  05/10/2006  Job:  [id number]

## 2010-11-07 NOTE — Op Note (Signed)
NAMEVICTORIAN, GUNN               ACCOUNT NO.:  [id number]   MEDICAL RECORD NO.:  [id number]          PATIENT TYPE:  AMB   LOCATION:  DSC                          FACILITY:  [facility]   PHYSICIAN:  Rose Phi. Maple Hudson, M.D.   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   POSTOPERATIVE DIAGNOSIS:  Stage 1 carcinoma of the right breast.   PROCEDURE:  1.  Blue dye injection.  2.  Right partial mastectomy with needle localization and specimen      mammogram.  3.  Right sentinel lymph node biopsies.   SURGEON:  Rose Phi. Maple Hudson, M.D.   ANESTHESIA:  General.   DESCRIPTION OF PROCEDURE:  Prior to coming to the operating room, the  patient had two localizing wires placed into the right breast on the lateral  side.  In addition, 1 millicurie of technetium sulfur colloid was injected  intradermally.   After suitable general anesthesia was induced, the patient was placed in the  supine position with the arms extended on the arm board.  5 cc of a mixture  of 2 cc of methylene blue and 3 cc of injectable saline was injected in the  subareolar tissue and the breast gently massaged for three minutes.  We then  prepped and draped in the standard fashion.   The lesion was at the 9 to 10 o'clock position of the right breast, and the  two wires were localized there, so I designed a radial incision with a skin  ellipse incorporating both wires.  The incision was then made, and a wide  excision of the wire and surrounding tissue was carried out.  Specimen  mammography confirmed the removal of the lesions, and it was submitted to  the pathologist to evaluate the margins.   While that was being done, we carefully scanned the axilla, and there was  good hot spot, and a short transverse right axillary incision was made with  dissection through the  subcutaneous tissue to the clavipectoral fascia.  Deep to the fascia was a hot and blue node which we removed.  There were no  other palpable blue or hot nodes.  That was submitted to the pathologist.   While these were being evaluated, we closed both incisions after injecting  them with 0.25% Marcaine.  They were closed in layers, and I closed the  partial mastectomy incision by mobilizing the glandular part of the breast  off of the chest wall in order to be able to partially close the defect to  give a better cosmetic appearance.  After subcuticular 4-0 Monocryl skin  closure, the Steri-Strips were applied.   The sentinel node was considered negative by the pathologist, and it was  felt like the margins were probably negative but would have to await the  permanent sections.   Dressings were applied, and the patient was transferred to the recovery room  in satisfactory condition, having tolerated the procedure well.  Rose Phi. Maple Hudson, M.D.  Electronically Signed     PRY/MEDQ  D:  10/29/2005  T:  10/30/2005  Job:  [id number]

## 2010-11-07 NOTE — Op Note (Signed)
[name], Kathryn [name]               ACCOUNT NO.:  [id number]   MEDICAL RECORD NO.:  [id number]          PATIENT TYPE:  AMB   LOCATION:  DSC                          FACILITY:  [facility]   PHYSICIAN:  Rose Phi. Maple Hudson, M.D.   DATE OF BIRTH:  1965-08-29   DATE OF PROCEDURE:  11/17/2005  DATE OF DISCHARGE:                                 OPERATIVE REPORT   PREOPERATIVE DIAGNOSIS:  Carcinoma of the right breast.   POSTOPERATIVE DIAGNOSIS:  Carcinoma of the right breast.   OPERATION PERFORMED:  Re-excision of right lumpectomy site.   SURGEON:  Rose Phi. Maple Hudson, M.D.   ANESTHESIA:  General.   DESCRIPTION OF PROCEDURE:  After suitable general anesthesia was induced,  the patient was placed in the supine position and the right breast prepped  and draped in the usual fashion.  I opened the long radial incision and  evacuated a small seroma.  The deeper part of the glandular tissue we had  closed after mobilizing it and so identified that and then excised for the  entire margins.  I oriented the specimen for the pathologist.  I was then  able to mobilize a little more tissue and then close the deep part of the  glands here without distorting the breast in order to help reconstruct it.  This was done with 3-0 Vicryl.  I then used interrupted mattress sutures of  4-0 nylon for the skin.  Dressing applied.  An Ace wrap was then placed  around her chest.  She was then transferred to the recovery room in  satisfactory condition having tolerated the procedure well.      Rose Phi. Maple Hudson, M.D.  Electronically Signed     PRY/MEDQ  D:  11/17/2005  T:  11/17/2005  Job:  [id number]

## 2010-11-07 NOTE — Op Note (Signed)
[name], [name]               ACCOUNT NO.:  [id number]   MEDICAL RECORD NO.:  [id number]          PATIENT TYPE:  AMB   LOCATION:  DSC                          FACILITY:  [facility]   PHYSICIAN:  Rose Phi. Maple Hudson, M.D.   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   OPERATION:  Completion right total mastectomy and tissue expander placement.   SURGEON:  Rose Phi. Maple Hudson, M.D.   ANESTHESIA:  General.   OPERATIVE PROCEDURE:  Kathryn [name] is a 45 year old female who presented  with what appeared to be a small invasive cancer.  She had had a lumpectomy  and sentinel node biopsy which was negative and had a T1B lesion with  extensive DCIS and positive margins.  We then re-excised her and she still  has considerable residual DCIS with tumor at the margins and it was elected  to do the mastectomy with reconstruction.   After suitable general anesthesia was induced, the patient was placed in  supine position with arms extended on the arm board and both breasts prepped  and draped.  The inframammary folds were marked.   A transverse elliptical incision preserving as much upper and medial skin as  possible was then outlined and incorporating the excision of the previous  lumpectomy site.  The incisions were then made and we carefully dissected  the superior flap going to near the clavicle and then medially to the medial  border of the sternum and inferiorly to the level of the inframammary fold  and laterally to latissimus dorsi muscle.  I then removed the breast by  dissecting from medial to lateral incorporating the pectoralis fascia.   We spent some time obtaining good hemostasis.   Dr. Jillyn Hidden then came to do the tissue expander and the  wound closure.  That  will be in a separate note.      Rose Phi. Maple Hudson, M.D.  Electronically Signed     PRY/MEDQ  D:  12/02/2005  T:  12/02/2005  Job:  [id number]   cc:   Pierce Crane, M.D.  Fax: [phone]

## 2010-11-07 NOTE — H&P (Signed)
NAME:  Kathryn [name], Kathryn [name] NO.:  [id number]   MEDICAL RECORD NO.:  [id number]                   PATIENT TYPE:  INP   LOCATION:  NA                                   FACILITY:  [facility]   PHYSICIAN:  Maxie Better, M.D.            DATE OF BIRTH:  05-Jul-1965   DATE OF ADMISSION:  12/27/2002  DATE OF DISCHARGE:                                HISTORY & PHYSICAL   CHIEF COMPLAINT:  1. Persistent breech presentation.  2. Primary cesarean section.   HISTORY OF PRESENT ILLNESS:  This is a 45 year old gravida 1, para 0,  married white female with an EDC of January 02, 2003, by ultrasound with a  persistent breech presentation who now presents for a primary cesarean  section.  The patient failed an attempt at external cephalic version at 37  weeks.  Good fetal movements have been noted, no contractions, the fetal  course has been otherwise unremarkable.   PRENATAL CARE:  Prenatal care is at [facility].   PRIMARY OBSTETRICIAN:  Maxie Better, M.D.   PRENATAL LABORATORY DATA:  Blood type is A negative, antibody screen is  negative, rubella is non-immune, RPR is nonreactive.  Hepatitis B surface  antigen is negative.  HIV test was negative.  GC and Chlamydia cultures are  negative.  Pap was normal.  Amniocentesis done for advanced maternal age was  normal.  Karyotype and alpha fetoprotein normal anatomic fetal survey at 24-  6/7 weeks on August 21, 2002.  Normal one hour glucose challenge test.  __________ culture was negative.   ALLERGIES:  No known drug allergies.   MEDICATIONS:  Prenatal vitamins.   PAST MEDICAL HISTORY:  Negative.   PAST SURGICAL HISTORY:  Emergency appendectomy in May 2003.   FAMILY HISTORY:  Negative.   SOCIAL HISTORY:  The patient is married, a Emergency planning/management officer, nonsmoker.   REVIEW OF SYSTEMS:  Negative.   PHYSICAL EXAMINATION:  GENERAL:  A well-developed, well-nourished gravid  white female in no acute distress.  VITAL  SIGNS:  Blood pressure 110/60, fetal heart rate of 158, weight is 203  pounds.  SKIN:  No lesions.  HEENT:  Anicteric sclerae.  Pink conjunctivae.  Oropharynx negative.  HEART:  Regular rate and rhythm without murmur.  LUNGS:  Clear to auscultation.  BREASTS:  Soft, nontender, no palpable mass.  ABDOMEN:  Gravid, fundal height of 34 cm.  EXTREMITIES:  No edema.  PELVIC:  Closed, 2 cm, long, presenting part low in the pelvis.   IMPRESSION:  1. Persistent breech presentation.  2. Term gestation.  3. Rubella not immune.   PLAN:  1. Admission.  2. Primary cesarean section.  3. Rubella vaccine postpartum.  The risks and benefits of the procedure have been explained to the patient  and her husband, including, but not limited to infection, bleeding which may  require blood transfusion, internal scar tissue, possible use of cesarean  sections  in the future, injury to surrounding organ structures.  Routine  admission labs were drawn, and the patient was informed of the criteria for  discharge and postoperative care.                                                 Maxie Better, M.D.    [place]/MEDQ  D:  12/20/2002  T:  12/20/2002  Job:  [id number]

## 2010-11-27 ENCOUNTER — Ambulatory Visit (INDEPENDENT_AMBULATORY_CARE_PROVIDER_SITE_OTHER): Payer: 59 | Admitting: Family Medicine

## 2010-11-27 ENCOUNTER — Encounter: Payer: Self-pay | Admitting: Family Medicine

## 2010-11-27 VITALS — BP 110/80 | Temp 98.1°F

## 2010-11-27 DIAGNOSIS — R1011 Right upper quadrant pain: Secondary | ICD-10-CM

## 2010-11-27 NOTE — Patient Instructions (Signed)
Avoid high fat foods. Follow up promptly for any fever or worsening abdominal pain We will call you regarding abdominal ultrasound

## 2010-11-27 NOTE — Progress Notes (Addendum)
  Subjective:    Patient ID: Kathryn [name], female    DOB: 10/02/1965, 45 y.o.   MRN: [id number]  HPI Patient seen with upper abdominal pain mostly right upper quadrant off and on past couple of weeks. Possibly worse after eating. Generally eats very healthy and avoid salt high fat foods. She's had sensation of postprandial bloating feeling. No nausea or vomiting. Weight and appetite stable. Somewhat similar symptoms last December CT abdomen and pelvis unremarkable. Patient has prior history of breast cancer. Again no appetite or weight changes. Previous colonoscopy screening last year normal. History of prior appendectomy.  Symptoms are moderate to severe and somewhat intermittent. Exercising without any intolerance. No chest pain. Denies any fever or chills.  Family history significant for mother with history of Crohn's disease.   Review of Systems  Constitutional: Negative for fever, chills, diaphoresis, appetite change, fatigue and unexpected weight change.  Respiratory: Negative for cough and shortness of breath.   Cardiovascular: Negative for chest pain.  Gastrointestinal: Positive for abdominal pain. Negative for blood in stool.  Genitourinary: Negative for dysuria and flank pain.  Skin: Negative for rash.  Neurological: Negative for dizziness and weakness.  Hematological: Negative for adenopathy. Does not bruise/bleed easily.       Objective:   Physical Exam  Constitutional: She is oriented to person, place, and time. She appears well-developed and well-nourished.  Neck: Neck supple.  Cardiovascular: Normal rate, regular rhythm and normal heart sounds.   Pulmonary/Chest: Effort normal and breath sounds normal. No respiratory distress. She has no wheezes. She has no rales.  Abdominal: Soft. Bowel sounds are normal. She exhibits no distension and no mass. There is no rebound and no guarding.       She has minimal tenderness right upper quadrant to deep palpation. No guarding or  rebound tenderness. No hepatomegaly  Lymphadenopathy:    She has no cervical adenopathy.  Neurological: She is alert and oriented to person, place, and time.          Assessment & Plan:  Patient presents with intermittent right upper quadrant abdominal pain. Prior CT scan several months ago unremarkable. Differential could include gallbladder disease versus gastric. Doubt musculoskeletal. Obtain abdominal ultrasound. Avoid fatty foods. Recent liver functions normal. Consider GI appointment if ultrasound unremarkable  Ultrasound normal.   For some reason results did not come into in basket.  Discussed with pt.  Will set up GI referral.  She has seen Dr Loreta Ave in past.  ?GB dysfunction.

## 2010-11-28 ENCOUNTER — Telehealth: Payer: Self-pay | Admitting: *Deleted

## 2010-12-01 ENCOUNTER — Ambulatory Visit
Admission: RE | Admit: 2010-12-01 | Discharge: 2010-12-01 | Disposition: A | Discharge status: Home / Self Care | Payer: 59 | Source: Ambulatory Visit | Attending: Family Medicine | Admitting: Family Medicine

## 2010-12-02 ENCOUNTER — Emergency Department (HOSPITAL_COMMUNITY)
Admission: EM | Admit: 2010-12-02 | Discharge: 2010-12-03 | Disposition: A | Discharge status: Home / Self Care | Payer: 59 | Attending: Emergency Medicine | Admitting: Emergency Medicine

## 2010-12-02 ENCOUNTER — Emergency Department (HOSPITAL_COMMUNITY): Payer: 59

## 2010-12-02 ENCOUNTER — Telehealth: Payer: Self-pay | Admitting: Family Medicine

## 2010-12-02 DIAGNOSIS — R110 Nausea: Secondary | ICD-10-CM | POA: Insufficient documentation

## 2010-12-02 DIAGNOSIS — Z79899 Other long term (current) drug therapy: Secondary | ICD-10-CM | POA: Insufficient documentation

## 2010-12-02 DIAGNOSIS — R1011 Right upper quadrant pain: Secondary | ICD-10-CM | POA: Insufficient documentation

## 2010-12-02 DIAGNOSIS — Z853 Personal history of malignant neoplasm of breast: Secondary | ICD-10-CM | POA: Insufficient documentation

## 2010-12-02 DIAGNOSIS — R630 Anorexia: Secondary | ICD-10-CM | POA: Insufficient documentation

## 2010-12-02 LAB — COMPREHENSIVE METABOLIC PANEL
ALT: 15 U/L (ref 0–35)
BUN: 12 mg/dL (ref 6–23)
CO2: 28 mEq/L (ref 19–32)
Calcium: 9 mg/dL (ref 8.4–10.5)
Creatinine, Ser: 0.83 mg/dL (ref 0.4–1.2)
GFR calc Af Amer: >60 mL/min (ref >60)
GFR calc non Af Amer: >60 mL/min (ref >60)
Glucose, Bld: 86 mg/dL (ref 70–99)
Total Protein: 6.9 g/dL (ref 6.0–8.3)

## 2010-12-02 LAB — CBC
MCH: 31 pg (ref 26.0–34.0)
MCHC: 34.9 g/dL (ref 30.0–36.0)
MCV: 89 fL (ref 78.0–100.0)
WBC: 5.8 10*3/uL (ref 4.0–10.5)

## 2010-12-02 LAB — URINALYSIS, ROUTINE W REFLEX MICROSCOPIC
Glucose, UA: NEGATIVE mg/dL
Ketones, ur: NEGATIVE mg/dL
Leukocytes, UA: NEGATIVE
Protein, ur: NEGATIVE mg/dL
Urobilinogen, UA: 0.2 mg/dL (ref 0.0–1.0)

## 2010-12-02 LAB — DIFFERENTIAL
Basophils Absolute: 0 10*3/uL (ref 0.0–0.1)
Eosinophils Absolute: 0.1 10*3/uL (ref 0.0–0.7)
Eosinophils Relative: 1 % (ref 0–5)

## 2010-12-02 LAB — LIPASE, BLOOD: Lipase: 28 U/L (ref 11–59)

## 2010-12-02 NOTE — Telephone Encounter (Signed)
Pt called req to get results from ultrasound. Pt is still in pain and is req results asap.

## 2010-12-03 ENCOUNTER — Telehealth: Payer: Self-pay | Admitting: *Deleted

## 2010-12-03 ENCOUNTER — Other Ambulatory Visit (HOSPITAL_COMMUNITY): Payer: Self-pay | Admitting: Gastroenterology

## 2010-12-03 MED ORDER — IOHEXOL 350 MG/ML SOLN: 100.0000 mL | Freq: Once | INTRAVENOUS | Status: DC | PRN

## 2010-12-03 NOTE — Progress Notes (Signed)
  Subjective:    Patient ID: Kathryn [name], female    DOB: [birth year], 45 y.o.   MRN: [id number]  HPI    Review of Systems     Objective:   Physical Exam        Assessment & Plan:

## 2010-12-03 NOTE — Telephone Encounter (Signed)
Office Message from Date: 12/02/2010 12:00:00 AM Time of Call: [phone] Faxed To: [facility] Fax Number: [phone] Facility: N/A Patient: Kathryn [name], Kathryn [name] DOB: [birth year] Phone: [phone] Provider: Caryl Never (Burr-shet), Elberta Fortis. Message: Pt husband is highly upset about not having received a call regarding his wife's ultrasound despite having called prior to office closing. Declined afterhours nurse callback per no access to scans. Wanted to express his frustration at not hearing anything about a scan performed yesterday. Regarding Appointment: Appt Date: Appt Time: Unknown Provider: Reason: Details: Outcome: Message Taken by: Arville Lime, CSR [facility]  [address] [place], [place] [address]  P: [phone]  F: [phone]

## 2010-12-03 NOTE — Telephone Encounter (Signed)
Pt husband called upset this AM, wife still in pain, escalated last night and took her to the ER.  They had called for [place] result at 3pm, however Dr Caryl Never and Harriett Sine were gone (their afternoon off and Harriett Sine left at 2:45.)  I apologized for the misunderstanding and informed Dr Caryl Never of [place] result request from 3pm yesterday and ER visit last night.  Offered OV today, will refer that question and need to Dr Caryl Never. Husband Sean's number [phone]

## 2010-12-03 NOTE — Progress Notes (Signed)
Addended by: Kristian Covey on: 12/03/2010 08:35 AM   Modules accepted: Orders

## 2010-12-03 NOTE — Telephone Encounter (Signed)
For some reason, ultrasound results did not come back to my in basket.  When patient called yesterday I was out of the office and this information was apparently not relayed to the family. I spoken with patient's husband this morning and explained things. We are in process of scheduling followup with gastroenterologist for further evaluation

## 2010-12-05 ENCOUNTER — Encounter (HOSPITAL_COMMUNITY)
Admission: RE | Admit: 2010-12-05 | Discharge: 2010-12-05 | Disposition: A | Discharge status: Home / Self Care | Payer: 59 | Source: Ambulatory Visit | Attending: Gastroenterology | Admitting: Gastroenterology

## 2010-12-05 DIAGNOSIS — R109 Unspecified abdominal pain: Secondary | ICD-10-CM | POA: Insufficient documentation

## 2010-12-05 MED ORDER — TECHNETIUM TC 99M MEBROFENIN IV KIT
5.0000 | PACK | Freq: Once | INTRAVENOUS | Status: AC | PRN
  Administered 2010-12-05: 5 via INTRAVENOUS

## 2010-12-08 NOTE — Telephone Encounter (Signed)
Opened in error

## 2011-02-05 ENCOUNTER — Other Ambulatory Visit: Payer: Self-pay | Admitting: Oncology

## 2011-02-05 ENCOUNTER — Encounter (HOSPITAL_BASED_OUTPATIENT_CLINIC_OR_DEPARTMENT_OTHER): Payer: 59 | Admitting: Oncology

## 2011-02-05 DIAGNOSIS — Z170 Estrogen receptor positive status [ER+]: Secondary | ICD-10-CM

## 2011-02-05 DIAGNOSIS — C50919 Malignant neoplasm of unspecified site of unspecified female breast: Secondary | ICD-10-CM

## 2011-02-05 LAB — CBC WITH DIFFERENTIAL/PLATELET
BASO%: 0.1 % (ref 0.0–2.0)
EOS%: 0.6 % (ref 0.0–7.0)
HCT: 38 % (ref 34.8–46.6)
LYMPH%: 23.4 % (ref 14.0–49.7)
MCH: 31.8 pg (ref 25.1–34.0)
MCHC: 34.8 g/dL (ref 31.5–36.0)
MONO#: 0.3 10*3/uL (ref 0.1–0.9)
NEUT%: 69.2 % (ref 38.4–76.8)
Platelets: 200 10*3/uL (ref 145–400)
RBC: 4.16 10*6/uL (ref 3.70–5.45)
WBC: 4.4 10*3/uL (ref 3.9–10.3)
lymph#: 1 10*3/uL (ref 0.9–3.3)

## 2011-02-06 LAB — COMPREHENSIVE METABOLIC PANEL
Albumin: 4.3 g/dL (ref 3.5–5.2)
BUN: 18 mg/dL (ref 6–23)
Calcium: 9.2 mg/dL (ref 8.4–10.5)
Chloride: 104 mEq/L (ref 96–112)
Glucose, Bld: 94 mg/dL (ref 70–99)
Potassium: 3.9 mEq/L (ref 3.5–5.3)

## 2011-02-06 LAB — CANCER ANTIGEN 27.29: CA 27.29: 22 U/mL (ref 0–39)

## 2011-02-06 LAB — VITAMIN D 25 HYDROXY (VIT D DEFICIENCY, FRACTURES): Vit D, 25-Hydroxy: 67 ng/mL (ref 30–89)

## 2011-07-21 ENCOUNTER — Other Ambulatory Visit (HOSPITAL_BASED_OUTPATIENT_CLINIC_OR_DEPARTMENT_OTHER): Payer: 59 | Admitting: Lab

## 2011-07-21 DIAGNOSIS — C50919 Malignant neoplasm of unspecified site of unspecified female breast: Secondary | ICD-10-CM

## 2011-07-21 LAB — COMPREHENSIVE METABOLIC PANEL
ALT: 16 U/L (ref 0–35)
AST: 24 U/L (ref 0–37)
BUN: 13 mg/dL (ref 6–23)
Creatinine, Ser: 0.81 mg/dL (ref 0.50–1.10)
Total Bilirubin: 0.3 mg/dL (ref 0.3–1.2)

## 2011-07-21 LAB — CBC WITH DIFFERENTIAL/PLATELET
BASO%: 0.3 % (ref 0.0–2.0)
EOS%: 1.3 % (ref 0.0–7.0)
HCT: 39.6 % (ref 34.8–46.6)
LYMPH%: 33.2 % (ref 14.0–49.7)
MCH: 31.5 pg (ref 25.1–34.0)
MCHC: 34.6 g/dL (ref 31.5–36.0)
MCV: 90.8 fL (ref 79.5–101.0)
NEUT%: 55.4 % (ref 38.4–76.8)
Platelets: 207 10*3/uL (ref 145–400)

## 2011-07-22 LAB — CANCER ANTIGEN 27.29: CA 27.29: 19 U/mL (ref 0–39)

## 2011-07-30 ENCOUNTER — Ambulatory Visit (HOSPITAL_BASED_OUTPATIENT_CLINIC_OR_DEPARTMENT_OTHER): Payer: 59 | Admitting: Oncology

## 2011-07-30 VITALS — BP 112/73 | HR 53 | Temp 97.6°F

## 2011-07-30 DIAGNOSIS — Z853 Personal history of malignant neoplasm of breast: Secondary | ICD-10-CM

## 2011-07-30 NOTE — Progress Notes (Signed)
Hematology and Oncology Follow Up Visit  Kathryn [name] [id number] 27-Aug-1965 46 y.o. 07/30/2011 11:10 AM PCP  Principle Diagnosis:   PROBLEM:  T1c N0 ER/PR positive breast cancer status post right modified radical mastectomy with tissue expander and reconstruction of the left breast with mammoplasty status post 4 cycles of every 3 week TC chemotherapy completed in 2008 status post 5 yea rs of tamoxifen  Interim History:  There have been no intercurrent illness, hospitalizations or medication changes. 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 She is active and works out. She is on new medications .  Medications: I have reviewed the patient's current medications.  Allergies: No Known Allergies  Past Medical History, Surgical history, Social history, and Family History were reviewed and updated.  Review of Systems: Constitutional:  Negative for fever, chills, night sweats, anorexia, weight loss, pain. Cardiovascular: no chest pain or dyspnea on exertion Respiratory: no cough, shortness of breath, or wheezing Neurological: no TIA or stroke symptoms Dermatological: negative ENT: negative Skin Gastrointestinal: no abdominal pain, change in bowel habits, or black or bloody stools Genito-Urinary: no dysuria, trouble voiding, or hematuria Hematological and Lymphatic: negative Breast: negative Musculoskeletal: negative Remaining ROS negative.  Physical Exam: Blood pressure 112/73, pulse 53, temperature 97.6 F (36.4 C), temperature source Oral. ECOG: 0 HEENT:  Sclerae anicteric, conjunctivae pink.  Oropharynx clear.  No mucositis or candidiasis.  Nodes:  No cervical, supraclavicular, or axillary lymphadenopathy palpated.  Breast Exam:  S/p bilateral mrm, with reconstruction; NED.Lungs:  Clear to auscultation bilaterally.  No crackles, rhonchi, or wheezes.  Heart:  Regular rate and rhythm.  Abdomen:  Soft, nontender.  Positive bowel sounds.  No organomegaly or masses palpated.   Musculoskeletal:  No focal spinal tenderness to palpation.  Extremities:  Benign.  No peripheral edema or cyanosis.  Skin:  Benign.  Neuro:  Nonfocal.  Lab Results: Lab Results  Component Value Date   WBC 3.3* 07/21/2011   HGB 13.7 07/21/2011   HCT 39.6 07/21/2011   MCV 90.8 07/21/2011   PLT 207 07/21/2011     Chemistry      Component Value Date/Time   NA 139 07/21/2011 1553   K 3.6 07/21/2011 1553   CL 102 07/21/2011 1553   CO2 26 07/21/2011 1553   BUN 13 07/21/2011 1553   CREATININE 0.81 07/21/2011 1553      Component Value Date/Time   CALCIUM 9.2 07/21/2011 1553   ALKPHOS 64 07/21/2011 1553   AST 24 07/21/2011 1553   ALT 16 07/21/2011 1553   BILITOT 0.3 07/21/2011 1553       Impression and Plan: Kathryn [name] is doing well, without evidence of recurrence. I will see her in 1 yr. She is 7 th yr of f/u.  More than 50% of the visit was spent in patient-related counselling   Pierce Crane, MD 2/7/201311:10 AM

## 2011-08-12 ENCOUNTER — Ambulatory Visit: Payer: 59 | Admitting: Family Medicine

## 2011-08-13 ENCOUNTER — Encounter: Payer: Self-pay | Admitting: Family Medicine

## 2011-08-13 ENCOUNTER — Ambulatory Visit (INDEPENDENT_AMBULATORY_CARE_PROVIDER_SITE_OTHER): Payer: 59 | Admitting: Family Medicine

## 2011-08-13 VITALS — BP 120/72 | Temp 97.9°F | Wt 169.0 lb

## 2011-08-13 DIAGNOSIS — J209 Acute bronchitis, unspecified: Secondary | ICD-10-CM

## 2011-08-13 MED ORDER — HYDROCODONE-HOMATROPINE 5-1.5 MG/5ML PO SYRP: 5.0000 mL | ORAL_SOLUTION | Freq: Four times a day (QID) | ORAL | Status: AC | PRN

## 2011-08-13 NOTE — Progress Notes (Signed)
  Subjective:    Patient ID: Kathryn [name], female    DOB: [birth year], 46 y.o.   MRN: [id number]  HPI  Acute visit. 3 weeks ago developed upper respiratory symptoms. Symptoms eventually improved somewhat. One week ago developed sore throat. Now has cough productive of green sputum. Had some sweats couple might go no definite fever. No dyspnea. No pleuritic pain. Took Robitussin-DM without relief. Cough is especially bothersome at night. Patient has no history of asthma. Nonsmoker.   Review of Systems  Constitutional: Negative for fever, chills and unexpected weight change.  HENT: Positive for congestion.   Respiratory: Positive for cough. Negative for shortness of breath and wheezing.   Cardiovascular: Negative for chest pain.  Neurological: Negative for headaches.       Objective:   Physical Exam  Constitutional: She appears well-developed and well-nourished.  HENT:  Right Ear: External ear normal.  Left Ear: External ear normal.  Mouth/Throat: Oropharynx is clear and moist.  Neck: Neck supple.  Cardiovascular: Normal rate and regular rhythm.   Pulmonary/Chest: Effort normal and breath sounds normal. No respiratory distress. She has no wheezes. She has no rales.  Musculoskeletal: She exhibits no edema.  Lymphadenopathy:    She has no cervical adenopathy.          Assessment & Plan:  Acute bronchitis. Suspect viral. Hycodan cough syrup for nighttime use as needed. Follow up when necessary

## 2011-08-13 NOTE — Patient Instructions (Signed)

## 2011-08-13 NOTE — Progress Notes (Signed)
Addended by: Kristian Covey on: 08/13/2011 09:04 AM   Modules accepted: Orders

## 2012-01-01 ENCOUNTER — Other Ambulatory Visit: Payer: Self-pay | Admitting: Gastroenterology

## 2012-01-01 DIAGNOSIS — R109 Unspecified abdominal pain: Secondary | ICD-10-CM

## 2012-01-07 ENCOUNTER — Ambulatory Visit
Admission: RE | Admit: 2012-01-07 | Discharge: 2012-01-07 | Disposition: A | Discharge status: Home / Self Care | Payer: 59 | Source: Ambulatory Visit | Attending: Gastroenterology | Admitting: Gastroenterology

## 2012-01-07 DIAGNOSIS — R109 Unspecified abdominal pain: Secondary | ICD-10-CM

## 2012-01-07 MED ORDER — IOHEXOL 300 MG/ML  SOLN
100.0000 mL | Freq: Once | INTRAMUSCULAR | Status: AC | PRN
  Administered 2012-01-07: 100 mL via INTRAVENOUS

## 2012-07-08 ENCOUNTER — Telehealth: Payer: Self-pay | Admitting: Oncology

## 2012-07-08 NOTE — Telephone Encounter (Signed)
I talked to the patient about Dr. Renelda Loma departure.   Pt understands that she will be called soon.

## 2012-07-22 ENCOUNTER — Encounter: Payer: Self-pay | Admitting: Oncology

## 2012-07-22 ENCOUNTER — Telehealth: Payer: Self-pay | Admitting: *Deleted

## 2012-07-22 NOTE — Telephone Encounter (Signed)
Called and spoke with patient to reschedule her appt. Confirmed appt. For 08/10/12 at 1045 with Ramond Craver, NP.  Then will become Dr. Darnelle Catalan.

## 2012-07-28 ENCOUNTER — Telehealth: Payer: Self-pay | Admitting: *Deleted

## 2012-07-28 NOTE — Telephone Encounter (Signed)
Left message for a return call to reschedule appt per provider.

## 2012-07-29 ENCOUNTER — Other Ambulatory Visit: Payer: 59 | Admitting: Lab

## 2012-07-29 ENCOUNTER — Ambulatory Visit: Payer: 59 | Admitting: Oncology

## 2012-08-10 ENCOUNTER — Other Ambulatory Visit: Payer: Self-pay | Admitting: Oncology

## 2012-08-10 ENCOUNTER — Ambulatory Visit (HOSPITAL_BASED_OUTPATIENT_CLINIC_OR_DEPARTMENT_OTHER): Payer: 59 | Admitting: Physician Assistant

## 2012-08-10 ENCOUNTER — Telehealth: Payer: Self-pay | Admitting: Oncology

## 2012-08-10 ENCOUNTER — Ambulatory Visit: Payer: 59 | Admitting: Lab

## 2012-08-10 ENCOUNTER — Encounter: Payer: Self-pay | Admitting: Physician Assistant

## 2012-08-10 VITALS — BP 111/68 | HR 55 | Temp 97.9°F | Resp 20 | Ht 69.25 in | Wt 173.1 lb

## 2012-08-10 DIAGNOSIS — Z853 Personal history of malignant neoplasm of breast: Secondary | ICD-10-CM

## 2012-08-10 LAB — COMPREHENSIVE METABOLIC PANEL (CC13)
Albumin: 4 g/dL (ref 3.5–5.0)
CO2: 28 mEq/L (ref 22–29)
Glucose: 98 mg/dl (ref 70–99)
Potassium: 4.1 mEq/L (ref 3.5–5.1)
Sodium: 141 mEq/L (ref 136–145)
Total Protein: 6.9 g/dL (ref 6.4–8.3)

## 2012-08-10 LAB — CBC WITH DIFFERENTIAL/PLATELET
Basophils Absolute: 0 10*3/uL (ref 0.0–0.1)
Eosinophils Absolute: 0 10*3/uL (ref 0.0–0.5)
HCT: 38.8 % (ref 34.8–46.6)
HGB: 13.4 g/dL (ref 11.6–15.9)
MCV: 90.1 fL (ref 79.5–101.0)
MONO%: 7.6 % (ref 0.0–14.0)
NEUT#: 1.8 10*3/uL (ref 1.5–6.5)
NEUT%: 51.1 % (ref 38.4–76.8)
RDW: 12.3 % (ref 11.2–14.5)
lymph#: 1.4 10*3/uL (ref 0.9–3.3)

## 2012-08-10 NOTE — Telephone Encounter (Signed)
Per pt she will go to get her x-ray done. No f/u appt needed.

## 2012-08-10 NOTE — Progress Notes (Signed)
ID: Kathryn [name]   DOB: [birth year]  MR#: [id number]  CSN#:[id number]  PCP: Kristian Covey, MD GYN:  SU:  OTHER MD:   HISTORY OF PRESENT ILLNESS: This woman has been in excellent health all of her life.  She had her first mammogram at age 47 and has had annual mammograms thereafter.  She had a diagnostic mammogram on 09/24/2005 with spot compression views of right breast showing faint focus of microcalcification.  Ultrasound showed a hyperechoic area measuring 6.4 x 7.8 x 8.6 mm.  Needle biopsy was recommended.  A followup ultrasound was done on 10/01/2005.  At that time, a palpable mass was appreciated at 9 o'clock.    This was biopsied on the same day and showed an intermediate grade invasive ductal cancer, the extent of tumor and biopsy was 8 mm.  This was ER/PR positive at 85% and 56% respectively.  Proliferative index was 39%.  HER-2 was 1+, FISH negative.  MRI scan of the breasts were performed on 10/07/2005.  This showed a greater extent of disease than seen on mammogram and ultrasound.  There was diffuse abnormality seen within the right breast in the upper outer quadrant.  This was measured through an area of 7.8 x 5.8 x 5.0 cm.  Within this was a superficial focal nodule measuring 1 cm in diameter correlating well with recently biopsied superficial nodule in the lower outer quadrant of the right breast, extending to involve the lateral portion of the lower inner quadrant, there is a similar vague area of enhancement.    MRI guided biopsy of that area was suggestive and was obtained on 10/15/2005.  Two different biopsies at 6 o'clock and 7 o'clock were performed that showed fibrocystic changes.    The patient elected to undergo a needle-localized lumpectomy on 10/29/2005.  At that time, a residual focus of invasive cancer was seen measuring 0.5 cm, extensive DCIS intermediate to high grade with focal necrosis and calcifications were seen as a background.  Margins were negative for invasive  cancer, but DCIS seemed to involve the superolateral deep margins focally.  Other margins were negative for tumor.  LVI was present.  One sentinal lymph node was obtained which was negative for tumor.  The patient underwent reexcision on 11/17/2005, but still with positive margins, she proceeded to a right mastectomy on 12/02/2005. Pathology from the mastectomy revealed clean margins.  Patient proceeded to adjuvant chemotherapy consisting of 4 q. three-week doses of docetaxel/cyclophosphamide given between July and September 2007. She was started on tamoxifen in October 2007 and continued until July 2012.  Patient is also status post prophylactic left mastectomy on 04/17/2009 with negative pathology.    INTERVAL HISTORY: Kathryn [name] returns today for a routine one-year followup of her right breast carcinoma. She completed her 5 year course of tamoxifen in July of 2012, and is now followed with observation alone.  Interval history is remarkable for the fact that Kathryn [name]'s mother passed away in Jun 06, 2013of stomach cancer. Otherwise, Kathryn [name] continues to work for the [employer]. She exercises regularly. She is very active with her 64-year-old daughter.   REVIEW OF SYSTEMS: Physically, Iyanla has no complaints today. She's had no recent illnesses and denies fevers or chills. Her energy level is good. She has no significant hot flashes. Her last menstrual cycle was approximately 7 months ago.  She is eating and drinking well with no nausea or change in bowel or bladder habits. She has no cough, shortness of breath, chest pain, or palpitations.  No abnormal headaches or dizziness. No unusual myalgias, arthralgias, bony pain, or peripheral swelling.  A detailed review of systems is otherwise noncontributory.   PAST MEDICAL HISTORY: Past Medical History  Diagnosis Date  . HYPERLIPIDEMIA 10/25/2008  . BREAST CANCER, HX OF 10/25/2008    PAST SURGICAL HISTORY: Past Surgical History  Procedure  Laterality Date  . Mastectomy  2007  . Appendectomy  2003  . Tonsillectomy  1974  . Cesarean section  2004    FAMILY HISTORY Family History  Problem Relation Age of Onset  . Arthritis Other   . Hyperlipidemia Other   . Hypertension Other   . Hyperlipidemia Mother   . Hypertension Mother     GYNECOLOGIC HISTORY: G1P1.  Menarche at age 31. First birth at age 60. Used birth-control pills, and her husband had a vasectomy. Currently has irregular periods, with LMP approximately 7 months ago.  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  They have one daughter,Kathryn [name], who is 26 years old.    ADVANCED DIRECTIVES:  HEALTH MAINTENANCE: History  Substance Use Topics  . Smoking status: Never Smoker   . Smokeless tobacco: Not on file  . Alcohol Use: Yes     Colonoscopy: none  PAP: UTD  Bone density: none  Lipid panel:  No Known Allergies  No current outpatient prescriptions on file.   No current facility-administered medications for this visit.    OBJECTIVE: Young white female who appears well and in no acute distress Filed Vitals:   08/10/12 1033  BP: 111/68  Pulse: 55  Temp: 97.9 F (36.6 C)  Resp: 20     Body mass index is 25.38 kg/(m^2).    ECOG FS: 0 Filed Weights   08/10/12 1033  Weight: 173 lb 1.6 oz (78.518 kg)   Sclerae unicteric Oropharynx clear No cervical or supraclavicular adenopathy Lungs clear to auscultation, no rales or rhonchi Heart regular rate and rhythm Abdomen soft, nontender, positive bowel sounds MSK no focal spinal tenderness, no peripheral edema Neuro: nonfocal, well oriented with positive affect Breasts:  status post bilateral mastectomies with implant reconstruction. No evidence of local recurrence on the right. Axillae are benign bilaterally with no palpable adenopathy.   LAB RESULTS: Lab Results  Component Value Date   WBC 3.6* 08/10/2012   NEUTROABS 1.8 08/10/2012    HGB 13.4 08/10/2012   HCT 38.8 08/10/2012   MCV 90.1 08/10/2012   PLT 210 08/10/2012      Chemistry      Component Value Date/Time   NA 141 08/10/2012 1237   NA 139 07/21/2011 1553   K 4.1 08/10/2012 1237   K 3.6 07/21/2011 1553   CL 104 08/10/2012 1237   CL 102 07/21/2011 1553   CO2 28 08/10/2012 1237   CO2 26 07/21/2011 1553   BUN 16.5 08/10/2012 1237   BUN 13 07/21/2011 1553   CREATININE 0.9 08/10/2012 1237   CREATININE 0.81 07/21/2011 1553      Component Value Date/Time   CALCIUM 9.6 08/10/2012 1237   CALCIUM 9.2 07/21/2011 1553   ALKPHOS 66 08/10/2012 1237   ALKPHOS 64 07/21/2011 1553   AST 24 08/10/2012 1237   AST 24 07/21/2011 1553   ALT 19 08/10/2012 1237   ALT 16 07/21/2011 1553   BILITOT 0.70 08/10/2012 1237   BILITOT 0.3 07/21/2011 1553       Lab Results  Component Value Date   LABCA2 19 07/21/2011  STUDIES: No results found.  ASSESSMENT: 47 y.o.  [place] woman  (1)  status post right lumpectomy on 10/29/2005 for a pT1b pN0 invasive ductal carcinoma, grade 3, 0.5 cm. There was also extensive ductal carcinoma in situ, intermediate to high grade with focal necrosis and calcifications. Margins were negative for invasive carcinoma, but were focally involved with DCIS. Lymphovascular invasion was present. 0 of 1 sentinel lymph node was involved.  (2)  status post reexcision on 11/17/2005, but again with positive margins. Eventually underwent right mastectomy on 12/02/2005 with clean margins.  (3)  Status post 4 q. three-week cycles of docetaxel/cyclophosphamide given in the adjuvant setting.  (4)  started on tamoxifen at 20 mg daily in October 2007 and continued until July of 2012.  (5) also status post prophylactic left mastectomy October of 2010 with benign pathology.   PLAN: This case was reviewed with Dr. Darnelle Catalan who also spoke with the patient today. She is ready to "graduate" from followup. She understands that we keep her records on file for 10 years, and she can  call at any time if she has any questions, changes or problems.  [place] is very much in favor of discontinuing followup.  Of course she had CTs of the abdomen and pelvis in July 2013 showing no evidence of metastatic disease. She is status post bilateral mastectomies, so we will obtain a quick chest x-ray and if that is unremarkable, we'll officially discharge her from followup.  Cherilynn Schomburg    08/10/2012

## 2012-10-21 ENCOUNTER — Ambulatory Visit (HOSPITAL_COMMUNITY)
Admission: RE | Admit: 2012-10-21 | Discharge: 2012-10-21 | Disposition: A | Discharge status: Home / Self Care | Payer: 59 | Source: Ambulatory Visit | Attending: Physician Assistant | Admitting: Physician Assistant

## 2012-10-21 DIAGNOSIS — Z9010 Acquired absence of unspecified breast and nipple: Secondary | ICD-10-CM | POA: Insufficient documentation

## 2012-10-21 DIAGNOSIS — Z853 Personal history of malignant neoplasm of breast: Secondary | ICD-10-CM

## 2013-02-15 ENCOUNTER — Other Ambulatory Visit (INDEPENDENT_AMBULATORY_CARE_PROVIDER_SITE_OTHER): Payer: 59

## 2013-02-15 DIAGNOSIS — Z0000 Encounter for general adult medical examination without abnormal findings: Secondary | ICD-10-CM

## 2013-02-15 LAB — CBC WITH DIFFERENTIAL/PLATELET
Eosinophils Relative: 1.5 % (ref 0.0–5.0)
HCT: 40.1 % (ref 36.0–46.0)
Hemoglobin: 13.8 g/dL (ref 12.0–15.0)
Lymphs Abs: 1 10*3/uL (ref 0.7–4.0)
Monocytes Relative: 7.4 % (ref 3.0–12.0)
Neutro Abs: 1.9 10*3/uL (ref 1.4–7.7)
RBC: 4.43 Mil/uL (ref 3.87–5.11)
WBC: 3.2 10*3/uL — ABNORMAL LOW (ref 4.5–10.5)

## 2013-02-15 LAB — POCT URINALYSIS DIPSTICK
Bilirubin, UA: NEGATIVE
Blood, UA: NEGATIVE
Nitrite, UA: NEGATIVE
pH, UA: 8

## 2013-02-16 LAB — LIPID PANEL
HDL: 43.3 mg/dL (ref >39.00)
LDL Cholesterol: 128 mg/dL — ABNORMAL HIGH (ref 0–99)
Total CHOL/HDL Ratio: 4

## 2013-02-16 LAB — BASIC METABOLIC PANEL
GFR: 75.85 mL/min (ref >60.00)
Potassium: 4 mEq/L (ref 3.5–5.1)
Sodium: 138 mEq/L (ref 135–145)

## 2013-02-16 LAB — HEPATIC FUNCTION PANEL
ALT: 20 U/L (ref 0–35)
AST: 29 U/L (ref 0–37)
Albumin: 4.3 g/dL (ref 3.5–5.2)
Total Bilirubin: 0.7 mg/dL (ref 0.3–1.2)

## 2013-02-16 LAB — TSH: TSH: 2.13 u[IU]/mL (ref 0.35–5.50)

## 2013-02-22 ENCOUNTER — Ambulatory Visit (INDEPENDENT_AMBULATORY_CARE_PROVIDER_SITE_OTHER): Payer: 59 | Admitting: Family Medicine

## 2013-02-22 ENCOUNTER — Encounter: Payer: Self-pay | Admitting: Family Medicine

## 2013-02-22 VITALS — BP 112/72 | HR 54 | Temp 97.3°F | Ht 69.0 in | Wt 176.0 lb

## 2013-02-22 DIAGNOSIS — Z0000 Encounter for general adult medical examination without abnormal findings: Secondary | ICD-10-CM

## 2013-02-22 DIAGNOSIS — R0789 Other chest pain: Secondary | ICD-10-CM

## 2013-02-22 NOTE — Patient Instructions (Addendum)
We will call you with cardiology appointment.

## 2013-02-22 NOTE — Progress Notes (Signed)
  Subjective:    Patient ID: Kathryn [name], female    DOB: 1966/03/21, 47 y.o.   MRN: [id number]  HPI Patient seen for complete physical She has history of breast cancer and has had double mastectomy . She continues to see gynecologist regularly. Mother had gastric cancer. Patient's had colonoscopy twice- most recently last year which was normal. Tetanus 2013. She exercises regularly.  She does report some atypical chest pains recent exercise and perhaps some increase fatigue more than usual. No consistent exertional symptoms. She does not have any family history of premature CAD. No history of smoking. No significant hyperlipidemia. No history of hypertension. No diabetes history.  Past Medical History  Diagnosis Date  . HYPERLIPIDEMIA 10/25/2008  . BREAST CANCER, HX OF 10/25/2008   Past Surgical History  Procedure Laterality Date  . Mastectomy Bilateral 2007 and 2009  . Appendectomy  2003  . Tonsillectomy  1974  . Cesarean section  2004    reports that she has never smoked. She does not have any smokeless tobacco history on file. She reports that  drinks alcohol. She reports that she does not use illicit drugs. family history includes Arthritis in her other; Hyperlipidemia in her mother and other; Hypertension in her mother and other. No Known Allergies    Review of Systems  Constitutional: Positive for fatigue. Negative for fever, activity change, appetite change and unexpected weight change.  HENT: Negative for hearing loss, ear pain, sore throat and trouble swallowing.   Eyes: Negative for visual disturbance.  Respiratory: Negative for cough and shortness of breath.   Cardiovascular: Negative for palpitations and leg swelling.  Gastrointestinal: Negative for abdominal pain, diarrhea, constipation and blood in stool.  Endocrine: Negative for polydipsia and polyuria.  Genitourinary: Negative for dysuria and hematuria.  Musculoskeletal: Negative for myalgias, back pain and  arthralgias.  Skin: Negative for rash.  Neurological: Negative for dizziness, syncope and headaches.  Hematological: Negative for adenopathy.  Psychiatric/Behavioral: Negative for confusion and dysphoric mood.       Objective:   Physical Exam  Constitutional: She is oriented to person, place, and time. She appears well-developed and well-nourished.  HENT:  Head: Normocephalic and atraumatic.  Eyes: EOM are normal. Pupils are equal, round, and reactive to light.  Neck: Normal range of motion. Neck supple. No thyromegaly present.  Cardiovascular: Normal rate, regular rhythm and normal heart sounds.   No murmur heard. Pulmonary/Chest: Breath sounds normal. No respiratory distress. She has no wheezes. She has no rales.  Abdominal: Soft. Bowel sounds are normal. She exhibits no distension and no mass. There is no tenderness. There is no rebound and no guarding.  Genitourinary:  Per GYN  Musculoskeletal: Normal range of motion. She exhibits no edema.  Lymphadenopathy:    She has no cervical adenopathy.  Neurological: She is alert and oriented to person, place, and time. She displays normal reflexes. No cranial nerve deficit.  Skin: No rash noted.  Psychiatric: She has a normal mood and affect. Her behavior is normal. Judgment and thought content normal.          Assessment & Plan:  Health maintenance. Recommend yearly flu vaccine. She plans to get through her employer. She'll continue with regular GYN followup. Colonoscopy up to date. Labs reviewed with patient with no significant abnormalities.  Continue with regular exercise habits.  Atypical chest symptoms. Low risk for CAD. Patient requesting referral to cardiology for further evaluation. Obtain baseline EKG. EKG shows sinus bradycardia with no acute abnormality.

## 2013-03-02 ENCOUNTER — Encounter: Payer: Self-pay | Admitting: Family Medicine

## 2013-04-04 ENCOUNTER — Ambulatory Visit (INDEPENDENT_AMBULATORY_CARE_PROVIDER_SITE_OTHER): Payer: 59 | Admitting: Surgery

## 2013-04-04 ENCOUNTER — Encounter (INDEPENDENT_AMBULATORY_CARE_PROVIDER_SITE_OTHER): Payer: Self-pay

## 2013-04-04 ENCOUNTER — Encounter (INDEPENDENT_AMBULATORY_CARE_PROVIDER_SITE_OTHER): Payer: Self-pay | Admitting: Surgery

## 2013-04-04 VITALS — BP 124/78 | HR 61 | Temp 97.5°F | Resp 14 | Ht 71.0 in | Wt 177.8 lb

## 2013-04-04 DIAGNOSIS — Z853 Personal history of malignant neoplasm of breast: Secondary | ICD-10-CM

## 2013-04-04 NOTE — Patient Instructions (Signed)
Continue annual follow ups

## 2013-04-04 NOTE — Progress Notes (Signed)
NAME: Kathryn [name]       DOB: 03/13/1966           DATE: 04/04/2013       MRN: [id number]  CC:   Chief Complaint  Patient presents with  . Breast Cancer Long Term Follow Up    yrly br ck    [name] is a 47 y.o.Marland Kitchenfemale who presents for routine followup of her Right IDC with extensive DCIS  diagnosed in April, 2007 and treated with mastectomy and SLN. She has had a subsequent prophylacticleft mastectomy and reconstruction. She has no problems or concerns on either side.  PFSH: She has had no significant changes since the last visit here.  ROS: There have been no significant changes since the last visit here  EXAM:  VS: BP 124/78  Pulse 61  Temp(Src) 97.5 F (36.4 C) (Temporal)  Resp 14  Ht 5\' 11"  (1.803 m)  Wt 177 lb 12.8 oz (80.65 kg)  BMI 24.81 kg/m2  SpO2 98%  General: The patient is alert, oriented, generally healthy appearing, NAD. Mood and affect are normal.  Breasts:  S/P bilateral mastectomy and reconstruction, no evidence of local recurrence  Lymphatics: She has no axillary or supraclavicular adenopathy on either side.  Extremities: Full ROM of the surgical side with no lymphedema noted.  Data Reviewed: Old office notes  Impression: Doing well, with no evidence of recurrent cancer or new cancer  Plan: Will continue to follow up on an annual basis here.Discussed self exam for local recurrence.

## 2013-04-20 ENCOUNTER — Encounter: Payer: Self-pay | Admitting: Cardiology

## 2013-04-20 ENCOUNTER — Ambulatory Visit (INDEPENDENT_AMBULATORY_CARE_PROVIDER_SITE_OTHER): Payer: 59 | Admitting: Cardiology

## 2013-04-20 ENCOUNTER — Encounter (INDEPENDENT_AMBULATORY_CARE_PROVIDER_SITE_OTHER): Payer: Self-pay

## 2013-04-20 VITALS — BP 124/82 | HR 50 | Ht 71.0 in | Wt 172.4 lb

## 2013-04-20 DIAGNOSIS — R0789 Other chest pain: Secondary | ICD-10-CM | POA: Insufficient documentation

## 2013-04-20 DIAGNOSIS — E785 Hyperlipidemia, unspecified: Secondary | ICD-10-CM

## 2013-04-20 DIAGNOSIS — Z853 Personal history of malignant neoplasm of breast: Secondary | ICD-10-CM

## 2013-04-20 NOTE — Progress Notes (Signed)
Kathryn [name] Date of Birth: [birth year] Medical Record #[id number]  History of Present Illness: Kathryn [name] is seen at the request of Dr. Caryl Never for evaluation of chest pain. She is a pleasant 47 year old white female who reports she has had intermittent pain in her left chest. She describes is a wincing sensation or pressure. He comes and goes. She is extremely active and participates in cross it. The symptoms do not occur with exertion. She is concerned given her history of breast cancer and previous chemotherapy. Her only cardiac risk factors a history of mildly elevated cholesterol. She has no history of hypertension, diabetes, or family history of heart disease. She is a nonsmoker. She reports she is always been very physically active. She currently works in [employer]. She ran track competitively in college. In 2007 she was diagnosed with breast cancer. She underwent a right mastectomy with subsequent reconstruction. 2010 she underwent prophylactic left mastectomy with subsequent reconstruction. She did receive chemotherapy initially with Adriamycin and Cytoxan. She did not receive radiation therapy.  No current outpatient prescriptions on file prior to visit.   No current facility-administered medications on file prior to visit.    No Known Allergies  Past Medical History  Diagnosis Date  . HYPERLIPIDEMIA 10/25/2008  . BREAST CANCER, HX OF 10/25/2008  . Cancer     Past Surgical History  Procedure Laterality Date  . Mastectomy Bilateral 2007 and 2009  . Appendectomy  2003  . Tonsillectomy  1974  . Breast surgery    . Cesarean section  2004    History  Smoking status  . Never Smoker   Smokeless tobacco  . Not on file    History  Alcohol Use  . Yes    Comment: 1-2 week    Family History  Problem Relation Age of Onset  . Arthritis Other   . Hyperlipidemia Other   . Hypertension Other   . Hyperlipidemia Mother   . Hypertension Mother   . Cancer Mother    gastric cancer  . Crohn's disease Mother     Review of Systems: The review of systems is positive for intermittent migraine headaches. She has occasional calf cramps when she exercises.  All other systems were reviewed and are negative.  Physical Exam: BP 124/82  Pulse 50  Ht 5\' 11"  (1.803 m)  Wt 172 lb 6.4 oz (78.2 kg)  BMI 24.06 kg/m2  SpO2 99% She is a pleasant white female in no acute distress. She appears very physically fit. HEENT: Normal Neck: No JVD, adenopathy, thyromegaly, or bruits. Chest: Status post breast reconstruction. Lungs: Clear Cardiovascular: Regular rate and rhythm. Normal S1 and S2 without gallop, murmur, or click. Abdomen: Soft and nontender. No masses or hepatosplenomegaly. Bowel sounds positive. Extremities: No cyanosis or edema. Pulses are 2+ and symmetric. Skin: Warm and dry Neuro: Alert oriented x3. Cranial nerves II through XII are intact.  LABORATORY DATA: Lab Results  Component Value Date   WBC 3.2* 02/15/2013   HGB 13.8 02/15/2013   HCT 40.1 02/15/2013   PLT 202.0 02/15/2013   GLUCOSE 83 02/15/2013   CHOL 194 02/15/2013   TRIG 116.0 02/15/2013   HDL 43.30 02/15/2013   LDLCALC 128* 02/15/2013   ALT 20 02/15/2013   AST 29 02/15/2013   NA 138 02/15/2013   K 4.0 02/15/2013   CL 104 02/15/2013   CREATININE 0.9 02/15/2013   BUN 12 02/15/2013   CO2 29 02/15/2013   TSH 2.13 02/15/2013   ECG dated 02/22/2013  shows normal sinus rhythm with a normal ECG.   Assessment / Plan: 1. Atypical chest pain. She is at very low risk for having coronary disease. She is premenopausal. Her only cardiac risk factor is mild hypercholesterolemia. I reviewed a chest CT she had in September 2012. This demonstrated no vascular calcification including her coronaries. There are long segments of the coronaries that could be visualized on this study with contrast and appear normal. I have recommended an echocardiogram since she has received previous cardiotoxic chemotherapy. If this is  normal then I would reassure her. I don't feel that stress testing is indicated at this point given her low pretest probability of disease.

## 2013-04-20 NOTE — Patient Instructions (Signed)
We will schedule you for an echocardiogram.

## 2013-04-27 ENCOUNTER — Other Ambulatory Visit: Payer: Self-pay

## 2013-05-04 ENCOUNTER — Ambulatory Visit (HOSPITAL_COMMUNITY): Payer: 59 | Attending: Cardiology | Admitting: Radiology

## 2013-05-04 ENCOUNTER — Encounter: Payer: Self-pay | Admitting: Cardiology

## 2013-05-04 DIAGNOSIS — R0789 Other chest pain: Secondary | ICD-10-CM

## 2013-05-04 DIAGNOSIS — Z853 Personal history of malignant neoplasm of breast: Secondary | ICD-10-CM | POA: Insufficient documentation

## 2013-05-04 DIAGNOSIS — I079 Rheumatic tricuspid valve disease, unspecified: Secondary | ICD-10-CM | POA: Insufficient documentation

## 2013-05-04 DIAGNOSIS — R079 Chest pain, unspecified: Secondary | ICD-10-CM | POA: Insufficient documentation

## 2013-05-04 DIAGNOSIS — E785 Hyperlipidemia, unspecified: Secondary | ICD-10-CM

## 2013-05-04 DIAGNOSIS — R072 Precordial pain: Secondary | ICD-10-CM

## 2013-05-04 NOTE — Progress Notes (Signed)
Echocardiogram performed.  

## 2013-05-05 NOTE — Telephone Encounter (Signed)
New Problem  Pt returning call for results

## 2013-11-14 ENCOUNTER — Ambulatory Visit (INDEPENDENT_AMBULATORY_CARE_PROVIDER_SITE_OTHER): Payer: 59 | Admitting: Family Medicine

## 2013-11-14 ENCOUNTER — Encounter: Payer: Self-pay | Admitting: Family Medicine

## 2013-11-14 VITALS — BP 110/78 | HR 51 | Temp 98.4°F | Ht 71.0 in | Wt 181.5 lb

## 2013-11-14 DIAGNOSIS — J310 Chronic rhinitis: Secondary | ICD-10-CM

## 2013-11-14 DIAGNOSIS — J329 Chronic sinusitis, unspecified: Secondary | ICD-10-CM

## 2013-11-14 MED ORDER — DOXYCYCLINE HYCLATE 100 MG PO TABS: 100.0000 mg | ORAL_TABLET | Freq: Two times a day (BID) | ORAL | Status: DC

## 2013-11-14 NOTE — Progress Notes (Signed)
No chief complaint on file.   HPI:  Kathryn [name], a 48 yo F patient of Dr. Elease Hashimoto, is here for an acute visit for:  1) URI/Cough: -started: 2 weeks ago, then resolved then started again 3-4 days ago -symptoms:nasal congestion, PND, cough, laryngitis mild -denies:fever, SOB, NVD, tooth pain, sinus pain -has tried: nothing -sick contacts/travel/risks: denies flu exposure, tick exposure or or Ebola risks -Hx of: mild allergies  ROS: See pertinent positives and negatives per HPI.  Past Medical History  Diagnosis Date  . HYPERLIPIDEMIA 10/25/2008  . BREAST CANCER, HX OF 10/25/2008  . Cancer     Past Surgical History  Procedure Laterality Date  . Mastectomy Bilateral 2007 and 2009  . Appendectomy  2003  . Tonsillectomy  1974  . Breast surgery    . Cesarean section  2004    Family History  Problem Relation Age of Onset  . Arthritis Other   . Hyperlipidemia Other   . Hypertension Other   . Hyperlipidemia Mother   . Hypertension Mother   . Cancer Mother     gastric cancer  . Crohn's disease Mother     History   Social History  . Marital Status: Married    Spouse Name: N/A    Number of Children: N/A  . Years of Education: N/A   Occupational History  . law enforcement    Social History Main Topics  . Smoking status: Never Smoker   . Smokeless tobacco: None  . Alcohol Use: Yes     Comment: 1-2 week  . Drug Use: No  . Sexual Activity: None   Other Topics Concern  . None   Social History Narrative  . None    Current outpatient prescriptions:BIOTIN PO, Take by mouth daily., Disp: , Rfl: ;  CALCIUM PO, Take by mouth daily., Disp: , Rfl: ;  Multiple Vitamin (MULTIVITAMIN) tablet, Take 1 tablet by mouth daily., Disp: , Rfl: ;  Omega-3 Fatty Acids (FISH OIL PO), Take by mouth every other day., Disp: , Rfl: ;  doxycycline (VIBRA-TABS) 100 MG tablet, Take 1 tablet (100 mg total) by mouth 2 (two) times daily., Disp: 20 tablet, Rfl: 0  EXAM:  Filed Vitals:   11/14/13 0927  BP: 110/78  Pulse: 51  Temp: 98.4 F (36.9 C)    Body mass index is 25.33 kg/(m^2).  GENERAL: vitals reviewed and listed above, alert, oriented, appears well hydrated and in no acute distress  HEENT: atraumatic, conjunttiva clear, no obvious abnormalities on inspection of external nose and ears, normal appearance of ear canals and TMs except clear effusion L,  nasal congestion L >R, mild post oropharyngeal erythema with PND, no tonsillar edema or exudate, no sinus TTP  NECK: no obvious masses on inspection  LUNGS: clear to auscultation bilaterally, no wheezes, rales or rhonchi, good air movement  CV: HRRR, no peripheral edema  MS: moves all extremities without noticeable abnormality  PSYCH: pleasant and cooperative, no obvious depression or anxiety  ASSESSMENT AND PLAN:  Discussed the following assessment and plan:  Rhinosinusitis - Plan: doxycycline (VIBRA-TABS) 100 MG tablet  -given HPI and exam findings today, a serious infection or illness is unlikely. We discussed potential etiologies, with VURI versus allergic rhinosinusitis being most likely, and advised supportive care and monitoring. We discussed treatment side effects, likely course, antibiotic misuse, transmission, and signs of developing a serious illness. -of course, we advised to return or notify a doctor immediately if symptoms worsen or persist or new concerns arise.  Patient Instructions  INSTRUCTIONS FOR UPPER RESPIRATORY INFECTION:  -plenty of rest and fluids  -nasal saline wash 2-3 times daily (use prepackaged nasal saline or bottled/distilled water if making your own)   -claritin or allegra daily and Nasacort per instructions for 21 days  -can use AFRIN nasal spray for drainage and nasal congestion - but do NOT use longer then 3-4 days  -can use tylenol or ibuprofen as directed for aches and sorethroat  -if you are taking a cough medication - use only as directed, may also try a  teaspoon of honey to coat the throat and throat lozenges  -for sore throat, salt water gargles can help  -follow up if you have fevers, facial pain, tooth pain, difficulty breathing or are worsening or not getting better in 5-7 days follow up and start the antibiotic (DOXYCYCLINE) -As we discussed, we have prescribed a new medication (DOXYCYCLINE) for you at this appointment. We discussed the common and serious potential adverse effects of this medication and you can review these and more with the pharmacist when you pick up your medication.  Please follow the instructions for use carefully and notify us immediately if you have any problems taking this medication.       [name]

## 2013-11-14 NOTE — Patient Instructions (Addendum)
INSTRUCTIONS FOR UPPER RESPIRATORY INFECTION:  -plenty of rest and fluids  -nasal saline wash 2-3 times daily (use prepackaged nasal saline or bottled/distilled water if making your own)   -claritin or allegra daily and Nasacort per instructions for 21 days  -can use AFRIN nasal spray for drainage and nasal congestion - but do NOT use longer then 3-4 days  -can use tylenol or ibuprofen as directed for aches and sorethroat  -if you are taking a cough medication - use only as directed, may also try a teaspoon of honey to coat the throat and throat lozenges  -for sore throat, salt water gargles can help  -follow up if you have fevers, facial pain, tooth pain, difficulty breathing or are worsening or not getting better in 5-7 days follow up and start the antibiotic (DOXYCYCLINE) -As we discussed, we have prescribed a new medication (DOXYCYCLINE) for you at this appointment. We discussed the common and serious potential adverse effects of this medication and you can review these and more with the pharmacist when you pick up your medication.  Please follow the instructions for use carefully and notify us immediately if you have any problems taking this medication.

## 2013-11-14 NOTE — Progress Notes (Signed)
Pre visit review using our clinic review tool, if applicable. No additional management support is needed unless otherwise documented below in the visit note. 

## 2013-11-15 ENCOUNTER — Ambulatory Visit: Payer: 59 | Admitting: Family Medicine

## 2014-02-16 ENCOUNTER — Other Ambulatory Visit (INDEPENDENT_AMBULATORY_CARE_PROVIDER_SITE_OTHER): Payer: 59

## 2014-02-16 DIAGNOSIS — Z0000 Encounter for general adult medical examination without abnormal findings: Secondary | ICD-10-CM

## 2014-02-16 LAB — LIPID PANEL
CHOLESTEROL: 193 mg/dL (ref 0–200)
HDL: 45.4 mg/dL (ref >39.00)
LDL Cholesterol: 132 mg/dL — ABNORMAL HIGH (ref 0–99)
NONHDL: 147.6
Total CHOL/HDL Ratio: 4
Triglycerides: 78 mg/dL (ref 0.0–149.0)
VLDL: 15.6 mg/dL (ref 0.0–40.0)

## 2014-02-16 LAB — TSH: TSH: 1.76 u[IU]/mL (ref 0.35–4.50)

## 2014-02-16 LAB — POCT URINALYSIS DIPSTICK
Blood, UA: NEGATIVE
GLUCOSE UA: NEGATIVE
Ketones, UA: NEGATIVE
LEUKOCYTES UA: NEGATIVE
NITRITE UA: NEGATIVE
PH UA: 6
Protein, UA: NEGATIVE
Spec Grav, UA: 1.02
Urobilinogen, UA: 0.2

## 2014-02-16 LAB — BASIC METABOLIC PANEL
BUN: 15 mg/dL (ref 6–23)
CHLORIDE: 104 meq/L (ref 96–112)
CO2: 27 mEq/L (ref 19–32)
CREATININE: 0.9 mg/dL (ref 0.4–1.2)
Calcium: 8.8 mg/dL (ref 8.4–10.5)
GFR: 69.82 mL/min (ref >60.00)
GLUCOSE: 77 mg/dL (ref 70–99)
POTASSIUM: 3.4 meq/L — AB (ref 3.5–5.1)
Sodium: 137 mEq/L (ref 135–145)

## 2014-02-16 LAB — HEPATIC FUNCTION PANEL
ALT: 14 U/L (ref 0–35)
AST: 26 U/L (ref 0–37)
Albumin: 4.2 g/dL (ref 3.5–5.2)
Alkaline Phosphatase: 60 U/L (ref 39–117)
BILIRUBIN TOTAL: 1 mg/dL (ref 0.2–1.2)
Bilirubin, Direct: 0 mg/dL (ref 0.0–0.3)
Total Protein: 7 g/dL (ref 6.0–8.3)

## 2014-02-16 LAB — CBC WITH DIFFERENTIAL/PLATELET
BASOS ABS: 0 10*3/uL (ref 0.0–0.1)
Basophils Relative: 0.4 % (ref 0.0–3.0)
EOS ABS: 0 10*3/uL (ref 0.0–0.7)
Eosinophils Relative: 0.6 % (ref 0.0–5.0)
HEMATOCRIT: 39.8 % (ref 36.0–46.0)
Hemoglobin: 13.6 g/dL (ref 12.0–15.0)
LYMPHS ABS: 1 10*3/uL (ref 0.7–4.0)
Lymphocytes Relative: 30 % (ref 12.0–46.0)
MCHC: 34.1 g/dL (ref 30.0–36.0)
MCV: 92.7 fl (ref 78.0–100.0)
MONO ABS: 0.3 10*3/uL (ref 0.1–1.0)
Monocytes Relative: 8 % (ref 3.0–12.0)
NEUTROS PCT: 61 % (ref 43.0–77.0)
Neutro Abs: 2 10*3/uL (ref 1.4–7.7)
PLATELETS: 217 10*3/uL (ref 150.0–400.0)
RBC: 4.3 Mil/uL (ref 3.87–5.11)
RDW: 13.3 % (ref 11.5–15.5)
WBC: 3.3 10*3/uL — ABNORMAL LOW (ref 4.0–10.5)

## 2014-02-23 ENCOUNTER — Encounter: Payer: Self-pay | Admitting: Family Medicine

## 2014-02-23 ENCOUNTER — Ambulatory Visit (INDEPENDENT_AMBULATORY_CARE_PROVIDER_SITE_OTHER): Payer: 59 | Admitting: Family Medicine

## 2014-02-23 VITALS — BP 110/68 | HR 60 | Temp 97.8°F | Ht 71.0 in | Wt 178.0 lb

## 2014-02-23 DIAGNOSIS — Z0000 Encounter for general adult medical examination without abnormal findings: Secondary | ICD-10-CM

## 2014-02-23 NOTE — Progress Notes (Signed)
Pre visit review using our clinic review tool, if applicable. No additional management support is needed unless otherwise documented below in the visit note. 

## 2014-02-23 NOTE — Progress Notes (Signed)
   Subjective:    Patient ID: Kathryn [name], female    DOB: Apr 22, 1966, 48 y.o.   MRN: [id number]  HPI  Here for complete physical. She sees gynecologist regularly. She had remote history of breast cancer and has been released from care of oncologist. She gets regular mammograms. She also colonoscopy 2013 with diminutive polyp right colon. She plans to get flu vaccine through work. She exercises regularly. Very health conscious. Has never smoked. Generally feels well. Takes no regular prescription medications. Tetanus up-to-date.  Past Medical History  Diagnosis Date  . HYPERLIPIDEMIA 10/25/2008  . BREAST CANCER, HX OF 10/25/2008  . Cancer    Past Surgical History  Procedure Laterality Date  . Mastectomy Bilateral 2007 and 2009  . Appendectomy  2003  . Tonsillectomy  1974  . Breast surgery    . Cesarean section  2004    reports that she has never smoked. She does not have any smokeless tobacco history on file. She reports that she drinks alcohol. She reports that she does not use illicit drugs. family history includes Arthritis in her other; Cancer in her mother; Crohn's disease in her mother; Hyperlipidemia in her mother and other; Hypertension in her mother and other. No Known Allergies    Review of Systems  Constitutional: Negative for fever, activity change, appetite change, fatigue and unexpected weight change.  HENT: Negative for ear pain, hearing loss, sore throat and trouble swallowing.   Eyes: Negative for visual disturbance.  Respiratory: Negative for cough and shortness of breath.   Cardiovascular: Negative for chest pain and palpitations.  Gastrointestinal: Negative for abdominal pain, diarrhea, constipation and blood in stool.  Genitourinary: Negative for dysuria and hematuria.  Musculoskeletal: Negative for arthralgias, back pain and myalgias.  Skin: Negative for rash.  Neurological: Negative for dizziness, syncope and headaches.  Hematological: Negative for  adenopathy.  Psychiatric/Behavioral: Negative for confusion and dysphoric mood.       Objective:   Physical Exam  Constitutional: She is oriented to person, place, and time. She appears well-developed and well-nourished.  HENT:  Head: Normocephalic and atraumatic.  Eyes: EOM are normal. Pupils are equal, round, and reactive to light.  Neck: Normal range of motion. Neck supple. No thyromegaly present.  Cardiovascular: Normal rate, regular rhythm and normal heart sounds.   No murmur heard. Pulmonary/Chest: Breath sounds normal. No respiratory distress. She has no wheezes. She has no rales.  Abdominal: Soft. Bowel sounds are normal. She exhibits no distension and no mass. There is no tenderness. There is no rebound and no guarding.  Musculoskeletal: Normal range of motion. She exhibits no edema.  Lymphadenopathy:    She has no cervical adenopathy.  Neurological: She is alert and oriented to person, place, and time. She displays normal reflexes. No cranial nerve deficit.  Skin: No rash noted.  Psychiatric: She has a normal mood and affect. Her behavior is normal. Judgment and thought content normal.          Assessment & Plan:  Complete physical. She'll get flu vaccine through work. Colonoscopy couple years ago. Tetanus up-to-date. Continue regular exercise habits. Labs reviewed. Minimally low potassium 3.4 and reviewed high potassium foods. She'll continue GYN followup.

## 2014-02-23 NOTE — Patient Instructions (Signed)
Potassium Content of Foods  Potassium is a mineral found in many foods and drinks. It helps keep fluids and minerals balanced in your body and affects how steadily your heart beats. Potassium also helps control your blood pressure and keep your muscles and nervous system healthy.  Certain health conditions and medicines may change the balance of potassium in your body. When this happens, you can help balance your level of potassium through the foods that you do or do not eat. Your health care provider or dietitian may recommend an amount of potassium that you should have each day. The following lists of foods provide the amount of potassium (in parentheses) per serving in each item.  HIGH IN POTASSIUM   The following foods and beverages have 200 mg or more of potassium per serving:  · Apricots, 2 raw or 5 dry (200 mg).  · Artichoke, 1 medium (345 mg).  · Avocado, raw,  ¼ each (245 mg).  · Banana, 1 medium (425 mg).  · Beans, lima, or baked beans, canned, ½ cup (280 mg).  · Beans, white, canned, ½ cup (595 mg).  · Beef roast, 3 oz (320 mg).  · Beef, ground, 3 oz (270 mg).  · Beets, raw or cooked, ½ cup (260 mg).  · Bran muffin, 2 oz (300 mg).  · Broccoli, ½ cup (230 mg).  · Brussels sprouts, ½ cup (250 mg).  · Cantaloupe, ½ cup (215 mg).  · Cereal, 100% bran, ½ cup (200-400 mg).  · Cheeseburger, single, fast food, 1 each (225-400 mg).  · Chicken, 3 oz (220 mg).  · Clams, canned, 3 oz (535 mg).  · Crab, 3 oz (225 mg).  · Dates, 5 each (270 mg).  · Dried beans and peas, ½ cup (300-475 mg).  · Figs, dried, 2 each (260 mg).  · Fish: halibut, tuna, cod, snapper, 3 oz (480 mg).  · Fish: salmon, haddock, swordfish, perch, 3 oz (300 mg).  · Fish, tuna, canned 3 oz (200 mg).  · French fries, fast food, 3 oz (470 mg).  · Granola with fruit and nuts, ½ cup (200 mg).  · Grapefruit juice, ½ cup (200 mg).  · Greens, beet, ½ cup (655 mg).  · Honeydew melon, ½ cup (200 mg).  · Kale, raw, 1 cup (300 mg).  · Kiwi, 1 medium (240  mg).  · Kohlrabi, rutabaga, parsnips, ½ cup (280 mg).  · Lentils, ½ cup (365 mg).  · Mango, 1 each (325 mg).  · Milk, chocolate, 1 cup (420 mg).  · Milk: nonfat, low-fat, whole, buttermilk, 1 cup (350-380 mg).  · Molasses, 1 Tbsp (295 mg).  · Mushrooms, ½ cup (280) mg.  · Nectarine, 1 each (275 mg).  · Nuts: almonds, peanuts, hazelnuts, [place], cashew, mixed, 1 oz (200 mg).  · Nuts, pistachios, 1 oz (295 mg).  · Orange, 1 each (240 mg).  · Orange juice, ½ cup (235 mg).  · Papaya, medium, ½ fruit (390 mg).  · Peanut butter, chunky, 2 Tbsp (240 mg).  · Peanut butter, smooth, 2 Tbsp (210 mg).  · Pear, 1 medium (200 mg).  · Pomegranate, 1 whole (400 mg).  · Pomegranate juice, ½ cup (215 mg).  · Pork, 3 oz (350 mg).  · Potato chips, salted, 1 oz (465 mg).  · Potato, baked with skin, 1 medium (925 mg).  · Potatoes, boiled, ½ cup (255 mg).  · Potatoes, mashed, ½ cup (330 mg).  · Prune juice, ½ cup (  370 mg).  · Prunes, 5 each (305 mg).  · Pudding, chocolate, ½ cup (230 mg).  · Pumpkin, canned, ½ cup (250 mg).  · Raisins, seedless, ¼ cup (270 mg).  · Seeds, sunflower or pumpkin, 1 oz (240 mg).  · Soy milk, 1 cup (300 mg).  · Spinach, ½ cup (420 mg).  · Spinach, canned, ½ cup (370 mg).  · Sweet potato, baked with skin, 1 medium (450 mg).  · Swiss chard, ½ cup (480 mg).  · Tomato or vegetable juice, ½ cup (275 mg).  · Tomato sauce or puree, ½ cup (400-550 mg).  · Tomato, raw, 1 medium (290 mg).  · Tomatoes, canned, ½ cup (200-300 mg).  · Turkey, 3 oz (250 mg).  · Wheat germ, 1 oz (250 mg).  · Winter squash, ½ cup (250 mg).  · Yogurt, plain or fruited, 6 oz (260-435 mg).  · Zucchini, ½ cup (220 mg).  MODERATE IN POTASSIUM  The following foods and beverages have 50-200 mg of potassium per serving:  · Apple, 1 each (150 mg).  · Apple juice, ½ cup (150 mg).  · Applesauce, ½ cup (90 mg).  · Apricot nectar, ½ cup (140 mg).  · Asparagus, small spears, ½ cup or 6 spears (155 mg).  · Bagel, cinnamon raisin, 1 each (130 mg).  · Bagel,  egg or plain, 4 in., 1 each (70 mg).  · Beans, green, ½ cup (90 mg).  · Beans, yellow, ½ cup (190 mg).  · Beer, regular, 12 oz (100 mg).  · Beets, canned, ½ cup (125 mg).  · Blackberries, ½ cup (115 mg).  · Blueberries, ½ cup (60 mg).  · Bread, whole wheat, 1 slice (70 mg).  · Broccoli, raw, ½ cup (145 mg).  · Cabbage, ½ cup (150 mg).  · Carrots, cooked or raw, ½ cup (180 mg).  · Cauliflower, raw, ½ cup (150 mg).  · Celery, raw, ½ cup (155 mg).  · Cereal, bran flakes, ½cup (120-150 mg).  · Cheese, cottage, ½ cup (110 mg).  · Cherries, 10 each (150 mg).  · Chocolate, 1½ oz bar (165 mg).  · Coffee, brewed 6 oz (90 mg).  · Corn, ½ cup or 1 ear (195 mg).  · Cucumbers, ½ cup (80 mg).  · Egg, large, 1 each (60 mg).  · Eggplant, ½ cup (60 mg).  · Endive, raw, ½cup (80 mg).  · English muffin, 1 each (65 mg).  · Fish, orange roughy, 3 oz (150 mg).  · Frankfurter, beef or pork, 1 each (75 mg).  · Fruit cocktail, ½ cup (115 mg).  · Grape juice, ½ cup (170 mg).  · Grapefruit, ½ fruit (175 mg).  · Grapes, ½ cup (155 mg).  · Greens: kale, turnip, collard, ½ cup (110-150 mg).  · Ice cream or frozen yogurt, chocolate, ½ cup (175 mg).  · Ice cream or frozen yogurt, vanilla, ½ cup (120-150 mg).  · Lemons, limes, 1 each (80 mg).  · Lettuce, all types, 1 cup (100 mg).  · Mixed vegetables, ½ cup (150 mg).  · Mushrooms, raw, ½ cup (110 mg).  · Nuts: walnuts, pecans, or macadamia, 1 oz (125 mg).  · Oatmeal, ½ cup (80 mg).  · Okra, ½ cup (110 mg).  · Onions, raw, ½ cup (120 mg).  · Peach, 1 each (185 mg).  · Peaches, canned, ½ cup (120 mg).  · Pears, canned, ½ cup (120 mg).  · Peas, green,   frozen, ½ cup (90 mg).  · Peppers, green, ½ cup (130 mg).  · Peppers, red, ½ cup (160 mg).  · Pineapple juice, ½ cup (165 mg).  · Pineapple, fresh or canned, ½ cup (100 mg).  · Plums, 1 each (105 mg).  · Pudding, vanilla, ½ cup (150 mg).  · Raspberries, ½ cup (90 mg).  · Rhubarb, ½ cup (115 mg).  · Rice, wild, ½ cup (80 mg).  · Shrimp, 3 oz (155  mg).  · Spinach, raw, 1 cup (170 mg).  · Strawberries, ½ cup (125 mg).  · Summer squash ½ cup (175-200 mg).  · Swiss chard, raw, 1 cup (135 mg).  · Tangerines, 1 each (140 mg).  · Tea, brewed, 6 oz (65 mg).  · Turnips, ½ cup (140 mg).  · Watermelon, ½ cup (85 mg).  · Wine, red, table, 5 oz (180 mg).  · Wine, white, table, 5 oz (100 mg).  LOW IN POTASSIUM  The following foods and beverages have less than 50 mg of potassium per serving.  · Bread, white, 1 slice (30 mg).  · Carbonated beverages, 12 oz (less than 5 mg).  · Cheese, 1 oz (20-30 mg).  · Cranberries, ½ cup (45 mg).  · Cranberry juice cocktail, ½ cup (20 mg).  · Fats and oils, 1 Tbsp (less than 5 mg).  · Hummus, 1 Tbsp (32 mg).  · Nectar: papaya, mango, or pear, ½ cup (35 mg).  · Rice, white or brown, ½ cup (50 mg).  · Spaghetti or macaroni, ½ cup cooked (30 mg).  · Tortilla, flour or corn, 1 each (50 mg).  · Waffle, 4 in., 1 each (50 mg).  · Water chestnuts, ½ cup (40 mg).  Document Released: 01/20/2005 Document Revised: 06/13/2013 Document Reviewed: 05/05/2013  [employer]® Patient Information ©2015 [employer]. This information is not intended to replace advice given to you by your health care provider. Make sure you discuss any questions you have with your health care provider.

## 2014-03-22 ENCOUNTER — Other Ambulatory Visit: Payer: Self-pay | Admitting: Gastroenterology

## 2014-03-22 DIAGNOSIS — R1031 Right lower quadrant pain: Secondary | ICD-10-CM

## 2014-03-29 ENCOUNTER — Ambulatory Visit
Admission: RE | Admit: 2014-03-29 | Discharge: 2014-03-29 | Disposition: A | Discharge status: Home / Self Care | Payer: 59 | Source: Ambulatory Visit | Attending: Gastroenterology | Admitting: Gastroenterology

## 2014-03-29 DIAGNOSIS — R1031 Right lower quadrant pain: Secondary | ICD-10-CM

## 2014-03-29 MED ORDER — IOHEXOL 300 MG/ML  SOLN
100.0000 mL | Freq: Once | INTRAMUSCULAR | Status: AC | PRN
  Administered 2014-03-29: 100 mL via INTRAVENOUS

## 2014-07-17 ENCOUNTER — Ambulatory Visit (INDEPENDENT_AMBULATORY_CARE_PROVIDER_SITE_OTHER): Payer: 59 | Admitting: Family Medicine

## 2014-07-17 ENCOUNTER — Encounter: Payer: Self-pay | Admitting: Family Medicine

## 2014-07-17 VITALS — BP 104/69 | HR 61 | Temp 98.3°F | Ht 71.0 in | Wt 170.0 lb

## 2014-07-17 DIAGNOSIS — J209 Acute bronchitis, unspecified: Secondary | ICD-10-CM

## 2014-07-17 MED ORDER — AZITHROMYCIN 250 MG PO TABS: ORAL_TABLET | ORAL | Status: DC

## 2014-07-17 NOTE — Progress Notes (Signed)
   Subjective:    Patient ID: Kathryn [name], female    DOB: June 19, 1966, 49 y.o.   MRN: [id number]  HPI Here for 6 days of chest congestion and coughing up green sputum. No fever. She had a ST at first but not now. Using Coricidin.    Review of Systems  Constitutional: Negative.   HENT: Positive for congestion and postnasal drip. Negative for sinus pressure.   Eyes: Negative.   Respiratory: Positive for cough and chest tightness. Negative for shortness of breath and wheezing.        Objective:   Physical Exam  Constitutional: She appears well-developed and well-nourished.  HENT:  Right Ear: External ear normal.  Left Ear: External ear normal.  Nose: Nose normal.  Mouth/Throat: Oropharynx is clear and moist.  Eyes: Conjunctivae are normal.  Neck: No thyromegaly present.  Pulmonary/Chest: Effort normal. No respiratory distress. She has no wheezes. She has no rales.  Scattered rhonchi   Lymphadenopathy:    She has no cervical adenopathy.          Assessment & Plan:  Add Mucinex.

## 2014-07-17 NOTE — Progress Notes (Signed)
Pre visit review using our clinic review tool, if applicable. No additional management support is needed unless otherwise documented below in the visit note. 

## 2014-07-23 ENCOUNTER — Telehealth: Payer: Self-pay | Admitting: Family Medicine

## 2014-07-23 NOTE — Telephone Encounter (Signed)
Pt states she has severe cough and cannot sleep. Saw dr fry 1/26 and has finished antibiotics.  Pt states she has never had a cough like this and concerned about further infections.. Would like a cb and rx for the cough if possible today.

## 2014-07-23 NOTE — Telephone Encounter (Signed)
Pt made appt for in the am, but wanted ti speak w/ someone thisevening

## 2014-07-24 ENCOUNTER — Encounter: Payer: Self-pay | Admitting: Family Medicine

## 2014-07-24 ENCOUNTER — Telehealth: Payer: Self-pay

## 2014-07-24 ENCOUNTER — Ambulatory Visit (INDEPENDENT_AMBULATORY_CARE_PROVIDER_SITE_OTHER): Payer: 59 | Admitting: Family Medicine

## 2014-07-24 VITALS — BP 124/80 | HR 60 | Temp 97.7°F | Wt 177.0 lb

## 2014-07-24 DIAGNOSIS — J209 Acute bronchitis, unspecified: Secondary | ICD-10-CM

## 2014-07-24 MED ORDER — BENZONATATE 200 MG PO CAPS: 200.0000 mg | ORAL_CAPSULE | Freq: Three times a day (TID) | ORAL | Status: DC | PRN

## 2014-07-24 MED ORDER — HYDROCODONE-HOMATROPINE 5-1.5 MG/5ML PO SYRP: 5.0000 mL | ORAL_SOLUTION | Freq: Four times a day (QID) | ORAL | Status: AC | PRN

## 2014-07-24 NOTE — Patient Instructions (Signed)

## 2014-07-24 NOTE — Progress Notes (Signed)
   Subjective:    Patient ID: Kathryn [name], female    DOB: 1965/09/13, 49 y.o.   MRN: [id number]  HPI Patient seen with persistent cough. She was just seen recently started on Zithromax. She's never had any fever. Cough initially was productive of yellow sputum but now clear. She's had minimal postnasal drip symptoms. No dyspnea. Nonsmoker. Took over-the-counter Robitussin cough syrup without relief. No GERD symptoms. No wheezing. Still exercising  Past Medical History  Diagnosis Date  . HYPERLIPIDEMIA 10/25/2008  . BREAST CANCER, HX OF 10/25/2008  . Cancer    Past Surgical History  Procedure Laterality Date  . Mastectomy Bilateral 2007 and 2009  . Appendectomy  2003  . Tonsillectomy  1974  . Breast surgery    . Cesarean section  2004    reports that she has never smoked. She has never used smokeless tobacco. She reports that she drinks alcohol. She reports that she does not use illicit drugs. family history includes Arthritis in her other; Cancer in her mother; Crohn's disease in her mother; Hyperlipidemia in her mother and other; Hypertension in her mother and other. No Known Allergies    Review of Systems  Constitutional: Negative for fever and chills.  Respiratory: Positive for cough. Negative for shortness of breath and wheezing.        Objective:   Physical Exam  Constitutional: She appears well-developed and well-nourished.  HENT:  Right Ear: External ear normal.  Left Ear: External ear normal.  Mouth/Throat: Oropharynx is clear and moist.  Neck: Neck supple.  Cardiovascular: Normal rate and regular rhythm.   Pulmonary/Chest: Effort normal and breath sounds normal. No respiratory distress. She has no wheezes. She has no rales.  Lymphadenopathy:    She has no cervical adenopathy.          Assessment & Plan:  Cough. Suspect recent acute viral bronchitis. No further antibiotics at this point. Benzonatate 200 mg every 8 hours as needed for cough. Printed  prescription for Hycodan cough syrup to use at night for severe cough if not relieved with the above. Follow-up for fever or worsening symptoms

## 2014-07-24 NOTE — Telephone Encounter (Signed)
She can discuss this with Dr. Elease Hashimoto later this morning

## 2014-07-24 NOTE — Progress Notes (Signed)
Pre visit review using our clinic review tool, if applicable. No additional management support is needed unless otherwise documented below in the visit note. 

## 2014-07-24 NOTE — Telephone Encounter (Signed)
[name] Patient Name: Kathryn [name] Gender: Female DOB: April 24, 1966 Age: 49 Y 40 M 26 D Return Phone Number: [phone] (Primary) Address: City/State/Zip: [place] Client [name] - Client Client Site [place] [facility] Contact Type Call Caller Name Same Caller Phone Number Same Relationship To Patient Self Is this call to report lab results? No Call Type General Information Initial Comment Caller states just finished abx, still coughing very bad, connected her with office General Information Type Call Transferred Nurse Assessment Guidelines Guideline Title Affirmed Question Affirmed Notes Nurse Date/Time ([place] Time) Disp. Time Eilene Ghazi Time) Disposition Final User 07/23/2014 4:07:38 PM General Information Provided Yes Fransico Michael After Care Instructions Given Call Event Type User Date / Time Description

## 2014-10-17 ENCOUNTER — Ambulatory Visit (INDEPENDENT_AMBULATORY_CARE_PROVIDER_SITE_OTHER): Payer: 59 | Admitting: Podiatry

## 2014-10-17 ENCOUNTER — Encounter: Payer: Self-pay | Admitting: Podiatry

## 2014-10-17 ENCOUNTER — Ambulatory Visit (INDEPENDENT_AMBULATORY_CARE_PROVIDER_SITE_OTHER): Payer: 59

## 2014-10-17 VITALS — BP 127/81 | HR 50 | Resp 18

## 2014-10-17 DIAGNOSIS — S93402A Sprain of unspecified ligament of left ankle, initial encounter: Secondary | ICD-10-CM

## 2014-10-17 DIAGNOSIS — R52 Pain, unspecified: Secondary | ICD-10-CM | POA: Diagnosis not present

## 2014-10-17 NOTE — Patient Instructions (Signed)

## 2014-10-17 NOTE — Progress Notes (Signed)
   Subjective:    Patient ID: Kathryn [name], female    DOB: [birth year], 49 y.o.   MRN: [id number]  HPI  49 year old female presents the office today with complaints of left ankle pain. She states that on Sunday she was running and she twisted her ankle describing inversion type injury. She states that afterwards she had some pain to her ankle however she was 1 mildly from her house for which she had a continued front to get there. She states that on Sunday and Monday she was having significant pain and some swelling to the area however she does state that the pain and swelling has decreased and she is able to now inflated regular shoe however with some discomfort to the outside aspect of her ankle. She states that she has had multiple ankle sprains before. She has been icing it. Denies any numbness or tingling. No other complaints at this time.   Review of Systems  All other systems reviewed and are negative.      Objective:   Physical Exam AAO x3, NAD DP/PT pulses palpable bilaterally, CRT less than 3 seconds Protective sensation intact with Simms Weinstein monofilament, vibratory sensation intact, Achilles tendon reflex intact There is tenderness along the left ATFL and CFL. There is no pain on the course of the PTFL. No pain on the course of the syndesmosis or along the deltoid ligaments. There is no pinpoint bony tenderness on the fibula, tibia, proximal tib-fib or to other areas of the foot/ankle. Ankle joint range of motion is intact. No pain with ankle joint range of motion subtotal range of motion. There is mild edema overlying the lateral aspect of the ankle. There is no associated erythema or increase in warmth. No other areas of tenderness to bilateral lower extremities. MMT 5/5, ROM WNL.  No open lesions or pre-ulcerative lesions.  No overlying edema, erythema, increase in warmth to bilateral lower extremities.  No pain with calf compression, swelling, warmth, erythema bilaterally.       Assessment & Plan:  49 year old female with left ankle sprain -X-rays were obtained and reviewed the patient. There is no definitive fracture identified at this time. -Treatment options were discussed include alternatives, risks, complications -Ankle brace was dispensed. -Ice and elevation -Anti-inflammatories as needed -Discussed the patient once the pain and swelling has decreased that she can start rehabilitation exercises which were dispensed to her today as well. If she has any increase in pain while prominence exercises decreased activity. -Follow up in 3 weeks or sooner if any problems are to arise. In the meantime call with any questions or concerns.

## 2014-11-07 ENCOUNTER — Encounter: Payer: Self-pay | Admitting: Podiatry

## 2014-11-07 ENCOUNTER — Ambulatory Visit (INDEPENDENT_AMBULATORY_CARE_PROVIDER_SITE_OTHER): Payer: 59 | Admitting: Podiatry

## 2014-11-07 VITALS — BP 103/65 | HR 64 | Resp 11

## 2014-11-07 DIAGNOSIS — S93402D Sprain of unspecified ligament of left ankle, subsequent encounter: Secondary | ICD-10-CM | POA: Diagnosis not present

## 2014-11-07 NOTE — Progress Notes (Signed)
Patient ID: Kathryn [name], female   DOB: March 13, 1966, 49 y.o.   MRN: [id number]  Subjective: 49 year old female presents the office they for follow-up evaluation of left ankle sprain. She states that since last point she is starting to increase her activity and going back to exercise without any problems. She has been continuing with the stretching and rehabilitation exercises. She did have to switch to an OTC brace as the one we dispensed was too tight. Denies any systemic complaints such as fevers, chills, nausea, vomiting. No acute changes since last appointment, and no other complaints at this time.   Objective: AAO x3, NAD DP/PT pulses palpable bilaterally, CRT less than 3 seconds Protective sensation intact with Simms Weinstein monofilament There is currently no tenderness palpation along the course of the ATFL, CFL, ATFL. There is mild discomfort within the anterior aspect the ankle syndesmosis. There is no areas of pinpoint bony tenderness or pain the vibratory sensation of the fibula, tibia or other areas of the foot/ankle. Ankle joint range of motion is intact and pain-free.  No other areas of pinpoint bony tenderness or pain with vibratory sensation. MMT 5/5, ROM WNL. No edema, erythema, increase in warmth to bilateral lower extremities.  No open lesions or pre-ulcerative lesions.  No pain with calf compression, swelling, warmth, erythema  Assessment:  49 year old female with resolving left ankle sprain.   Plan: -All treatment options discussed with the patient including all alternatives, risks, complications.  -Recommended continue with the range of motion activities as well as ankle brace as needed. She can start increasing activities as tolerated and wean off wearing the brace. Anti-inflammatories as needed.  -Follow-up in 4 weeks at the symptoms have not resolved or sooner if any problems are to arise.  -Patient encouraged to call the office with any questions, concerns, change in  symptoms.

## 2015-02-27 ENCOUNTER — Other Ambulatory Visit (INDEPENDENT_AMBULATORY_CARE_PROVIDER_SITE_OTHER): Payer: Commercial Managed Care - HMO

## 2015-02-27 DIAGNOSIS — Z0000 Encounter for general adult medical examination without abnormal findings: Secondary | ICD-10-CM

## 2015-02-27 LAB — BASIC METABOLIC PANEL
BUN: 17 mg/dL (ref 6–23)
CHLORIDE: 105 meq/L (ref 96–112)
CO2: 30 meq/L (ref 19–32)
CREATININE: 0.92 mg/dL (ref 0.40–1.20)
Calcium: 9.5 mg/dL (ref 8.4–10.5)
GFR: 68.65 mL/min (ref >60.00)
Glucose, Bld: 86 mg/dL (ref 70–99)
POTASSIUM: 4.7 meq/L (ref 3.5–5.1)
SODIUM: 140 meq/L (ref 135–145)

## 2015-02-27 LAB — CBC WITH DIFFERENTIAL/PLATELET
BASOS PCT: 0.6 % (ref 0.0–3.0)
Basophils Absolute: 0 10*3/uL (ref 0.0–0.1)
EOS ABS: 0.1 10*3/uL (ref 0.0–0.7)
EOS PCT: 1.4 % (ref 0.0–5.0)
HCT: 41 % (ref 36.0–46.0)
Hemoglobin: 14 g/dL (ref 12.0–15.0)
LYMPHS PCT: 30.8 % (ref 12.0–46.0)
Lymphs Abs: 1.1 10*3/uL (ref 0.7–4.0)
MCHC: 34.2 g/dL (ref 30.0–36.0)
MCV: 91.3 fl (ref 78.0–100.0)
MONOS PCT: 8.1 % (ref 3.0–12.0)
Monocytes Absolute: 0.3 10*3/uL (ref 0.1–1.0)
NEUTROS ABS: 2.2 10*3/uL (ref 1.4–7.7)
NEUTROS PCT: 59.1 % (ref 43.0–77.0)
Platelets: 226 10*3/uL (ref 150.0–400.0)
RBC: 4.49 Mil/uL (ref 3.87–5.11)
RDW: 12.8 % (ref 11.5–15.5)
WBC: 3.7 10*3/uL — AB (ref 4.0–10.5)

## 2015-02-27 LAB — LIPID PANEL
CHOL/HDL RATIO: 4
CHOLESTEROL: 208 mg/dL — AB (ref 0–200)
HDL: 46.3 mg/dL (ref >39.00)
LDL CALC: 140 mg/dL — AB (ref 0–99)
NonHDL: 161.32
TRIGLYCERIDES: 107 mg/dL (ref 0.0–149.0)
VLDL: 21.4 mg/dL (ref 0.0–40.0)

## 2015-02-27 LAB — HEPATIC FUNCTION PANEL
ALK PHOS: 69 U/L (ref 39–117)
ALT: 14 U/L (ref 0–35)
AST: 21 U/L (ref 0–37)
Albumin: 4.5 g/dL (ref 3.5–5.2)
BILIRUBIN DIRECT: 0.1 mg/dL (ref 0.0–0.3)
BILIRUBIN TOTAL: 0.7 mg/dL (ref 0.2–1.2)
TOTAL PROTEIN: 6.9 g/dL (ref 6.0–8.3)

## 2015-02-27 LAB — TSH: TSH: 3.59 u[IU]/mL (ref 0.35–4.50)

## 2015-03-06 ENCOUNTER — Encounter: Payer: Self-pay | Admitting: Family Medicine

## 2015-03-06 ENCOUNTER — Ambulatory Visit (INDEPENDENT_AMBULATORY_CARE_PROVIDER_SITE_OTHER): Payer: Commercial Managed Care - HMO | Admitting: Family Medicine

## 2015-03-06 VITALS — BP 90/70 | HR 58 | Temp 98.5°F | Ht 71.0 in | Wt 179.8 lb

## 2015-03-06 DIAGNOSIS — Z0000 Encounter for general adult medical examination without abnormal findings: Secondary | ICD-10-CM | POA: Diagnosis not present

## 2015-03-06 NOTE — Progress Notes (Signed)
   Subjective:    Patient ID: Kathryn [name], female    DOB: April 21, 1966, 49 y.o.   MRN: [id number]  HPI    Review of Systems     Objective:   Physical Exam        Assessment & Plan:

## 2015-03-06 NOTE — Progress Notes (Signed)
   Subjective:    Patient ID: Kathryn [name], female    DOB: April 21, 1966, 49 y.o.   MRN: [id number]  HPI Patient seen for complete physical. She's had previous breast cancer and subsequently had prophylactic mastectomy of the other breast. She's done extremely well for several years. She runs and does regular exercise several times per week. Never smoked. She continues to see gynecologist regularly. She is getting regular Pap smears. She had colonoscopy 3 years ago. No major complaints Plans to get flu vaccine through her work.  Past Medical History  Diagnosis Date  . HYPERLIPIDEMIA 10/25/2008  . BREAST CANCER, HX OF 10/25/2008  . Cancer    Past Surgical History  Procedure Laterality Date  . Mastectomy Bilateral 2007 and 2009  . Appendectomy  2003  . Tonsillectomy  1974  . Breast surgery    . Cesarean section  2004    reports that she has never smoked. She has never used smokeless tobacco. She reports that she drinks alcohol. She reports that she does not use illicit drugs. family history includes Arthritis in her other; Cancer in her mother; Crohn's disease in her mother; Hyperlipidemia in her mother and other; Hypertension in her mother and other. No Known Allergies    Review of Systems  Constitutional: Negative for fever, activity change, appetite change, fatigue and unexpected weight change.  HENT: Negative for ear pain, hearing loss, sore throat and trouble swallowing.   Eyes: Negative for visual disturbance.  Respiratory: Negative for cough and shortness of breath.   Cardiovascular: Negative for chest pain and palpitations.  Gastrointestinal: Negative for abdominal pain, diarrhea, constipation and blood in stool.  Genitourinary: Negative for dysuria and hematuria.  Musculoskeletal: Negative for myalgias, back pain and arthralgias.  Skin: Negative for rash.  Neurological: Negative for dizziness, syncope and headaches.  Hematological: Negative for adenopathy.    Psychiatric/Behavioral: Negative for confusion and dysphoric mood.       Objective:   Physical Exam  Constitutional: She is oriented to person, place, and time. She appears well-developed and well-nourished.  HENT:  Head: Normocephalic and atraumatic.  Eyes: EOM are normal. Pupils are equal, round, and reactive to light.  Neck: Normal range of motion. Neck supple. No thyromegaly present.  Cardiovascular: Normal rate, regular rhythm and normal heart sounds.   No murmur heard. Pulmonary/Chest: Breath sounds normal. No respiratory distress. She has no wheezes. She has no rales.  Abdominal: Soft. Bowel sounds are normal. She exhibits no distension and no mass. There is no tenderness. There is no rebound and no guarding.  Musculoskeletal: Normal range of motion. She exhibits no edema.  Lymphadenopathy:    She has no cervical adenopathy.  Neurological: She is alert and oriented to person, place, and time. She displays normal reflexes. No cranial nerve deficit.  Skin: No rash noted.  Psychiatric: She has a normal mood and affect. Her behavior is normal. Judgment and thought content normal.          Assessment & Plan:  Complete physical. Labs unremarkable.  She will get flu vaccine through work and continue GYN follow-up.  She will check on insurance coverage for shingles vaccine

## 2015-03-06 NOTE — Progress Notes (Signed)
Pre visit review using our clinic review tool, if applicable. No additional management support is needed unless otherwise documented below in the visit note. 

## 2015-08-21 LAB — HM PAP SMEAR: HM PAP: NORMAL

## 2015-11-20 ENCOUNTER — Encounter: Payer: Self-pay | Admitting: Genetic Counselor

## 2015-12-02 ENCOUNTER — Encounter: Payer: Self-pay | Admitting: Genetic Counselor

## 2016-01-01 ENCOUNTER — Other Ambulatory Visit: Payer: Commercial Managed Care - HMO

## 2016-01-01 ENCOUNTER — Encounter: Payer: Commercial Managed Care - HMO | Admitting: Genetic Counselor

## 2016-01-07 ENCOUNTER — Ambulatory Visit (INDEPENDENT_AMBULATORY_CARE_PROVIDER_SITE_OTHER): Payer: Commercial Managed Care - HMO | Admitting: Family Medicine

## 2016-01-07 VITALS — BP 100/70 | HR 84 | Ht 71.0 in | Wt 170.0 lb

## 2016-01-07 DIAGNOSIS — G43109 Migraine with aura, not intractable, without status migrainosus: Secondary | ICD-10-CM

## 2016-01-07 DIAGNOSIS — G43809 Other migraine, not intractable, without status migrainosus: Secondary | ICD-10-CM

## 2016-01-07 DIAGNOSIS — M542 Cervicalgia: Secondary | ICD-10-CM

## 2016-01-07 NOTE — Progress Notes (Signed)
Pre visit review using our clinic review tool, if applicable. No additional management support is needed unless otherwise documented below in the visit note. 
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
## 2016-01-08 ENCOUNTER — Ambulatory Visit (INDEPENDENT_AMBULATORY_CARE_PROVIDER_SITE_OTHER)
Admission: RE | Admit: 2016-01-08 | Discharge: 2016-01-08 | Disposition: A | Discharge status: Home / Self Care | Payer: Commercial Managed Care - HMO | Source: Ambulatory Visit | Attending: Family Medicine | Admitting: Family Medicine

## 2016-01-08 DIAGNOSIS — M542 Cervicalgia: Secondary | ICD-10-CM

## 2016-01-09 ENCOUNTER — Telehealth: Payer: Self-pay | Admitting: Family Medicine

## 2016-01-09 DIAGNOSIS — M542 Cervicalgia: Secondary | ICD-10-CM

## 2016-01-09 NOTE — Telephone Encounter (Signed)
Order entered

## 2016-01-09 NOTE — Telephone Encounter (Signed)
Pt would like to proceed with the referral to zachary smith for neck pain

## 2016-01-13 ENCOUNTER — Other Ambulatory Visit: Payer: Commercial Managed Care - HMO

## 2016-01-13 ENCOUNTER — Ambulatory Visit (HOSPITAL_BASED_OUTPATIENT_CLINIC_OR_DEPARTMENT_OTHER): Payer: Commercial Managed Care - HMO | Admitting: Genetic Counselor

## 2016-01-13 ENCOUNTER — Encounter: Payer: Self-pay | Admitting: Genetic Counselor

## 2016-01-13 DIAGNOSIS — Z803 Family history of malignant neoplasm of breast: Secondary | ICD-10-CM | POA: Diagnosis not present

## 2016-01-13 DIAGNOSIS — Z800 Family history of malignant neoplasm of digestive organs: Secondary | ICD-10-CM

## 2016-01-13 DIAGNOSIS — Z315 Encounter for genetic counseling: Secondary | ICD-10-CM | POA: Diagnosis not present

## 2016-01-13 DIAGNOSIS — Z853 Personal history of malignant neoplasm of breast: Secondary | ICD-10-CM

## 2016-01-13 NOTE — Progress Notes (Signed)
REFERRING PROVIDER: [name], MD [place], [place] [address]   Lurline Del, MD  Rolm Bookbinder, MD  PRIMARY PROVIDER:  [name], MD  PRIMARY REASON FOR VISIT:  1. BREAST CANCER, HX OF   2. Family history of stomach cancer   3. Family history of breast cancer   4. Family history of colon cancer      HISTORY OF PRESENT ILLNESS:   Kathryn [name], a 50 y.o. female, was seen for a [facility] cancer genetics consultation at the request of Dr. Donne Hazel due to a personal and family history of cancer.  Kathryn [name] presents to clinic today to discuss the possibility of a hereditary predisposition to cancer, genetic testing, and to further clarify her future cancer risks, as well as potential cancer risks for family members.   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 This was treated with right sided mastectomy, chemotherapy and tamoxifen.  In 2010, Kathryn [name] proceeded with prophylactic left mastectomy.  The tumor was ER+/PR+/Her2-.  Kathryn [name] was seen by Dr. Donne Hazel, who suggested that she talk with a genetic counselor to determine whether she would be interested in pursuing genetic testing.      CANCER HISTORY:   No history exists.     HORMONAL RISK FACTORS:  Menarche was at age 18-13.  First live birth at age 73.  Ovaries intact: yes.  Hysterectomy: no.  Colonoscopy: yes; patient reports being screened every 3-5 years based on her mother's diagnosis of stomach cancer and that her mother had colon problems that resulted in a total colectomy.. Mammogram within the last year: patient has had a double mastectomy. Up to date with pelvic exams:  yes. Any excessive radiation exposure in the past:  no  Past Medical History:  Diagnosis Date  . BREAST CANCER, HX OF 10/25/2008  . Cancer ([place])   . Family history of breast cancer   . Family history of breast cancer   . Family  history of colon cancer   . Family history of stomach cancer   . HYPERLIPIDEMIA 10/25/2008    Past Surgical History:  Procedure Laterality Date  . APPENDECTOMY  2003  . BREAST SURGERY    . CESAREAN SECTION  2004  . MASTECTOMY Bilateral 2007 and 2009  . TONSILLECTOMY  1974    Social History   Social History  . Marital status: Married    Spouse name: N/A  . Number of children: N/A  . 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 Smoking status: Never Smoker  . Smokeless tobacco: Never Used  . Alcohol use 0.0 oz/week     Comment: 1-2 week  . Drug use: No  . Sexual activity: Not Asked   Other Topics Concern  . None   Social History Narrative  . None     FAMILY HISTORY:  We obtained a detailed, 4-generation family history.  Significant diagnoses are listed below: Family History  Problem Relation Age of Onset  . Hyperlipidemia Mother   . Hypertension Mother   . Crohn's disease Mother   . Cancer Mother     gastric cancer  . Arthritis Other   . Hyperlipidemia Other   . Hypertension Other   . Aneurysm Maternal Aunt 40    brain  . Breast cancer Paternal Aunt     dx in  her late 17s  . Colon cancer Paternal Uncle     dx and died in his 7s  . Mental illness Paternal Uncle     Patient's maternal ancestors are of [place], [place] and [place] descent, and paternal ancestors are of [place] and [place] descent. There is no reported Ashkenazi Jewish ancestry. There is no known consanguinity.  GENETIC COUNSELING ASSESSMENT: Kathryn [name] is a 50 y.o. female with a personal history of premenopausal breast cancer and a family history of breast, colon and stomach cance which is somewhat suggestive of a hereditary cancer syndrome and predisposition to cancer. We, therefore, discussed and recommended the following at today's visit.   DISCUSSION: We discussed that about 5-10% of breast cancer is  hereditary with most cases due to BRCA mutations.  We discussed that there are several other genes that could increase the risk for breast cancer to some extent.  Patient was unsure if she had genetic testing in the past, and I had thought that we had received a copy of previous testing.  I called [facility] to confirm whether she had genetic testing and they are closed for a [employer] in [place].  I will contact the patient to let her know whether genetic testing was performed in the past.  We discussed that if she tests negative, then this would be sufficient enough until testing improves either by incraesing the knowledge of the number of genes tested or our technology improves to the point where repeat testing would be necessary.  We reviewed the characteristics, features and inheritance patterns of hereditary cancer syndromes. We also discussed genetic testing, including the appropriate family members to test, the process of testing, insurance coverage and turn-around-time for results. We discussed the implications of a negative, positive and/or variant of uncertain significant result. We recommended Kathryn [name] pursue genetic testing for the Breast/Ovarian cancer gene panel. The Breast/Ovarian gene panel offered by [facility] includes sequencing and rearrangement analysis for the following 20 genes:  ATM, BARD1, BRCA1, BRCA2, BRIP1, CDH1, CHEK2, EPCAM, FANCC, MLH1, MSH2, MSH6, NBN, PALB2, PMS2, PTEN, RAD51C, RAD51D, TP53, and XRCC2.     Based on Kathryn [name]'s personal and family history of cancer, she meets medical criteria for genetic testing. Despite that she meets criteria, she may still have an out of pocket cost. We discussed that if her out of pocket cost for testing is over $100, the laboratory will call and confirm whether she wants to proceed with testing.  If the out of pocket cost of testing is less than $100 she will be billed by the genetic testing laboratory.   PLAN: Despite our  recommendation, Kathryn [name] did not wish to pursue genetic testing at today's visit. We understand this decision, and remain available to coordinate genetic testing at any time in the future. We; therefore, recommend Kathryn [name] continue to follow the cancer screening guidelines given by her primary healthcare provider.  I will follow up with her about any previous testing that may have been performed.  Lastly, we encouraged Kathryn [name] to remain in contact with cancer genetics annually so that we can continuously update the family history and inform her of any changes in cancer genetics and testing that may be of benefit for this family.   Ms.  [name] questions were answered to her satisfaction today. Our contact information was provided should additional questions or concerns arise. Thank you for the referral and allowing [place] to share in the care of your patient.   Santiago Glad  Merton Border, MS, [facility] Certified Genetic Counselor [email or web address] phone: [phone]  The patient was seen for a total of 50 minutes in face-to-face genetic counseling.  This patient was discussed with Drs. Magrinat, Lindi Adie and/or Burr Medico who agrees with the above.    _______________________________________________________________________ For Office Staff:  Number of people involved in session: 1 Was an Intern/ student involved with case: no
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
## 2016-01-14 ENCOUNTER — Ambulatory Visit (INDEPENDENT_AMBULATORY_CARE_PROVIDER_SITE_OTHER): Payer: Commercial Managed Care - HMO | Admitting: Family Medicine

## 2016-01-14 ENCOUNTER — Encounter: Payer: Self-pay | Admitting: Family Medicine

## 2016-01-14 DIAGNOSIS — M9902 Segmental and somatic dysfunction of thoracic region: Secondary | ICD-10-CM | POA: Diagnosis not present

## 2016-01-14 DIAGNOSIS — M542 Cervicalgia: Secondary | ICD-10-CM

## 2016-01-14 DIAGNOSIS — M9908 Segmental and somatic dysfunction of rib cage: Secondary | ICD-10-CM

## 2016-01-14 DIAGNOSIS — G8929 Other chronic pain: Secondary | ICD-10-CM

## 2016-01-14 DIAGNOSIS — M999 Biomechanical lesion, unspecified: Secondary | ICD-10-CM | POA: Insufficient documentation

## 2016-01-14 DIAGNOSIS — M9901 Segmental and somatic dysfunction of cervical region: Secondary | ICD-10-CM | POA: Diagnosis not present

## 2016-01-14 NOTE — Patient Instructions (Signed)
Good to see you.  Ice 20 minutes 2 times daily. Usually after activity and before bed. I am ok with you lifting, keep hands within peripheral vision  On wall with heels, butt shoulder and head touching for a goal of 5 minutes daily  Keep monitor at eye level Tennis ball between shoulder blades with sitting for a long amount of time 2 tennis balls in a tube sock and lay on them where head meets neck.  Vitamin D 2000 IU Daily  Turmeric 500mg  daily  See me again in 3-4 weeks for more manipulation and to make sure you are doing better.

## 2016-01-14 NOTE — Assessment & Plan Note (Signed)
Patient does have neck pain. I do think that this is multifactorial. Some of it is poor posture, working environment, stress as well as muscle tightness. Patient wants to wait medications. Patient's x-rays were unremarkable with history of breast cancer. Patient was given home exercises and work with Product/process development scientist today. We discussed icing regimen. We discussed which activities to do an which was potentially avoid. Patient is been very well to osteopathic manipulation as well. Patient come back in 4 weeks for further evaluation and treatment.

## 2016-01-14 NOTE — Progress Notes (Signed)
Pre visit review using our clinic review tool, if applicable. No additional management support is needed unless otherwise documented below in the visit note. 

## 2016-01-14 NOTE — Assessment & Plan Note (Signed)
Decision today to treat with OMT was based on Physical Exam  After verbal consent patient was treated with muscle energy in cervical, HVLA and FPR techniques in thoracic and rib areas  Patient tolerated the procedure well with improvement in symptoms  Patient given exercises, stretches and lifestyle modifications  See medications in patient instructions if given  Patient will follow up in 3-4 weeks
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
## 2016-02-18 NOTE — Assessment & Plan Note (Addendum)
Patient's did seem to respond to osteopathic manipulation. Wants to avoid intact to medications if possible. Patient states continues to have discomfort. We did discuss with her about potential advance imaging with patient's history of breast cancer but no mass felt. Patient states that she would like to continue with conservative therapy.

## 2016-02-19 ENCOUNTER — Ambulatory Visit (INDEPENDENT_AMBULATORY_CARE_PROVIDER_SITE_OTHER): Payer: Commercial Managed Care - HMO | Admitting: Family Medicine

## 2016-02-19 ENCOUNTER — Encounter: Payer: Self-pay | Admitting: Family Medicine

## 2016-02-19 VITALS — BP 102/70 | HR 57

## 2016-02-19 DIAGNOSIS — M9908 Segmental and somatic dysfunction of rib cage: Secondary | ICD-10-CM | POA: Diagnosis not present

## 2016-02-19 DIAGNOSIS — M542 Cervicalgia: Secondary | ICD-10-CM

## 2016-02-19 DIAGNOSIS — G8929 Other chronic pain: Secondary | ICD-10-CM

## 2016-02-19 DIAGNOSIS — M9901 Segmental and somatic dysfunction of cervical region: Secondary | ICD-10-CM | POA: Diagnosis not present

## 2016-02-19 DIAGNOSIS — M9902 Segmental and somatic dysfunction of thoracic region: Secondary | ICD-10-CM

## 2016-02-19 DIAGNOSIS — M999 Biomechanical lesion, unspecified: Secondary | ICD-10-CM

## 2016-02-19 NOTE — Assessment & Plan Note (Signed)
Decision today to treat with OMT was based on Physical Exam  After verbal consent patient was treated with muscle energy in cervical, HVLA and FPR techniques in thoracic and rib areas  Patient tolerated the procedure well with improvement in symptoms  Patient given exercises, stretches and lifestyle modifications  See medications in patient instructions if given  Patient will follow up in 3-4 weeks

## 2016-02-19 NOTE — Patient Instructions (Signed)
God to see you  We will hold on PT or with the MRI I think this may take time and I know that is frustrating but I think you will do good.  I would start Yoga 1-2 times a week.  Keep working on posture and keeping the left shoulder down See me again in 4-5 weeks.

## 2016-03-02 ENCOUNTER — Other Ambulatory Visit (INDEPENDENT_AMBULATORY_CARE_PROVIDER_SITE_OTHER): Payer: Commercial Managed Care - HMO

## 2016-03-02 DIAGNOSIS — Z0000 Encounter for general adult medical examination without abnormal findings: Secondary | ICD-10-CM

## 2016-03-02 DIAGNOSIS — Z853 Personal history of malignant neoplasm of breast: Secondary | ICD-10-CM

## 2016-03-02 LAB — HEPATIC FUNCTION PANEL
ALK PHOS: 63 U/L (ref 39–117)
ALT: 17 U/L (ref 0–35)
AST: 25 U/L (ref 0–37)
Albumin: 4.6 g/dL (ref 3.5–5.2)
BILIRUBIN DIRECT: 0.1 mg/dL (ref 0.0–0.3)
BILIRUBIN TOTAL: 0.5 mg/dL (ref 0.2–1.2)
Total Protein: 7 g/dL (ref 6.0–8.3)

## 2016-03-02 LAB — BASIC METABOLIC PANEL
BUN: 20 mg/dL (ref 6–23)
CO2: 27 mEq/L (ref 19–32)
Calcium: 9.1 mg/dL (ref 8.4–10.5)
Chloride: 104 mEq/L (ref 96–112)
Creatinine, Ser: 0.83 mg/dL (ref 0.40–1.20)
GFR: 77 mL/min (ref >60.00)
Glucose, Bld: 94 mg/dL (ref 70–99)
POTASSIUM: 4.1 meq/L (ref 3.5–5.1)
Sodium: 136 mEq/L (ref 135–145)

## 2016-03-02 LAB — CBC WITH DIFFERENTIAL/PLATELET
BASOS ABS: 0 10*3/uL (ref 0.0–0.1)
Basophils Relative: 0.6 % (ref 0.0–3.0)
EOS ABS: 0 10*3/uL (ref 0.0–0.7)
EOS PCT: 0.5 % (ref 0.0–5.0)
HCT: 41 % (ref 36.0–46.0)
HEMOGLOBIN: 14.1 g/dL (ref 12.0–15.0)
Lymphocytes Relative: 32.5 % (ref 12.0–46.0)
Lymphs Abs: 1.1 10*3/uL (ref 0.7–4.0)
MCHC: 34.3 g/dL (ref 30.0–36.0)
MCV: 90.6 fl (ref 78.0–100.0)
MONO ABS: 0.2 10*3/uL (ref 0.1–1.0)
Monocytes Relative: 6.5 % (ref 3.0–12.0)
Neutro Abs: 2 10*3/uL (ref 1.4–7.7)
Neutrophils Relative %: 59.9 % (ref 43.0–77.0)
Platelets: 220 10*3/uL (ref 150.0–400.0)
RBC: 4.52 Mil/uL (ref 3.87–5.11)
RDW: 12.6 % (ref 11.5–15.5)
WBC: 3.3 10*3/uL — AB (ref 4.0–10.5)

## 2016-03-02 LAB — LIPID PANEL
CHOL/HDL RATIO: 5
Cholesterol: 231 mg/dL — ABNORMAL HIGH (ref 0–200)
HDL: 49.2 mg/dL (ref >39.00)
LDL CALC: 160 mg/dL — AB (ref 0–99)
NonHDL: 182.09
Triglycerides: 112 mg/dL (ref 0.0–149.0)
VLDL: 22.4 mg/dL (ref 0.0–40.0)

## 2016-03-02 LAB — TSH: TSH: 2.62 u[IU]/mL (ref 0.35–4.50)

## 2016-03-03 LAB — AFP TUMOR MARKER: AFP TUMOR MARKER: 4.6 ng/mL (ref <6.1)

## 2016-03-10 ENCOUNTER — Encounter: Payer: Self-pay | Admitting: Family Medicine

## 2016-03-10 ENCOUNTER — Ambulatory Visit (INDEPENDENT_AMBULATORY_CARE_PROVIDER_SITE_OTHER): Payer: Commercial Managed Care - HMO | Admitting: Family Medicine

## 2016-03-10 VITALS — BP 100/80 | HR 85 | Temp 97.6°F | Ht 70.5 in | Wt 170.0 lb

## 2016-03-10 DIAGNOSIS — Z0000 Encounter for general adult medical examination without abnormal findings: Secondary | ICD-10-CM

## 2016-03-10 NOTE — Progress Notes (Signed)
Subjective:     Patient ID: Kathryn [name], female   DOB: 09-17-1965, 50 y.o.   MRN: [id number]  HPI Patient seen for physical. She has history of breast cancer remotely several years ago and mother had gastric cancer. She has mild hyperlipidemia. She is currently on no prescription medications. She's had some recent upper back and neck pain which is slowly improving with some home exercises and she is also doing hot yoga which seems to be helping. She plans to get flu vaccine through work. Tetanus up-to-date. Colonoscopy up-to-date. She's had bilateral mastectomies. She gets Pap smears per gynecologist. Exercises most days of the week.  Past Medical History:  Diagnosis Date  . BREAST CANCER, HX OF 10/25/2008  . Cancer ([place])   . Family history of breast cancer   . Family history of breast cancer   . Family history of colon cancer   . Family history of stomach cancer   . HYPERLIPIDEMIA 10/25/2008   Past Surgical History:  Procedure Laterality Date  . APPENDECTOMY  2003  . BREAST SURGERY    . CESAREAN SECTION  2004  . MASTECTOMY Bilateral 2007 and 2009  . TONSILLECTOMY  1974    reports that she has never smoked. She has never used smokeless tobacco. She reports that she drinks alcohol. She reports that she does not use drugs. family history includes Aneurysm (age of onset: 28) in her maternal aunt; Arthritis in her other; Breast cancer in her paternal aunt; Cancer in her mother; Colon cancer in her paternal uncle; Crohn's disease in her mother; Hyperlipidemia in her mother and other; Hypertension in her mother and other; Mental illness in her paternal uncle. No Known Allergies   Review of Systems  Constitutional: Negative for activity change, appetite change, fatigue, fever and unexpected weight change.  HENT: Negative for ear pain, hearing loss, sore throat and trouble swallowing.   Eyes: Negative for visual disturbance.  Respiratory: Negative for cough, chest tightness, shortness of  breath and wheezing.   Cardiovascular: Negative for chest pain, palpitations and leg swelling.  Gastrointestinal: Negative for abdominal pain, blood in stool, constipation and diarrhea.  Genitourinary: Negative for dysuria and hematuria.  Musculoskeletal: Negative for arthralgias, back pain and myalgias.  Skin: Negative for rash.  Neurological: Negative for dizziness, seizures, syncope, weakness, light-headedness and headaches.  Hematological: Negative for adenopathy.  Psychiatric/Behavioral: Negative for confusion and dysphoric mood.       Objective:   Physical Exam  Constitutional: She is oriented to person, place, and time. She appears well-developed and well-nourished.  HENT:  Head: Normocephalic and atraumatic.  Eyes: EOM are normal. Pupils are equal, round, and reactive to light.  Neck: Normal range of motion. Neck supple. No thyromegaly present.  Cardiovascular: Normal rate, regular rhythm and normal heart sounds.   No murmur heard. Pulmonary/Chest: Breath sounds normal. No respiratory distress. She has no wheezes. She has no rales.  Abdominal: Soft. Bowel sounds are normal. She exhibits no distension and no mass. There is no tenderness. There is no rebound and no guarding.  Musculoskeletal: Normal range of motion. She exhibits no edema.  Lymphadenopathy:    She has no cervical adenopathy.  Neurological: She is alert and oriented to person, place, and time. She displays normal reflexes. No cranial nerve deficit.  Skin: No rash noted.  Psychiatric: She has a normal mood and affect. Her behavior is normal. Judgment and thought content normal.       Assessment:     Physical exam. Healthy 50 year old  female with remote history of breast cancer    Plan:     -Labs reviewed with no major concerns. She has elevated lipids but overall low risk for CAD -She plans to get flu vaccine through work -Continue GYN follow-up for Pap smears  [name] MD [facility]  at [facility]
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
## 2016-03-26 ENCOUNTER — Ambulatory Visit (INDEPENDENT_AMBULATORY_CARE_PROVIDER_SITE_OTHER): Payer: Commercial Managed Care - HMO | Admitting: Family Medicine

## 2016-03-26 ENCOUNTER — Encounter: Payer: Self-pay | Admitting: Family Medicine

## 2016-03-26 VITALS — BP 118/72 | HR 55

## 2016-03-26 DIAGNOSIS — G8929 Other chronic pain: Secondary | ICD-10-CM | POA: Diagnosis not present

## 2016-03-26 DIAGNOSIS — M999 Biomechanical lesion, unspecified: Secondary | ICD-10-CM | POA: Diagnosis not present

## 2016-03-26 DIAGNOSIS — M542 Cervicalgia: Secondary | ICD-10-CM

## 2016-03-26 NOTE — Assessment & Plan Note (Signed)
Doing much better at this time. I do not feel that patient will need to follow-up with me on a regular routine at this time. Patient encouraged to continue to work on posture, ergonomics and more flexibility. Follow-up as needed.

## 2016-03-26 NOTE — Assessment & Plan Note (Signed)
Decision today to treat with OMT was based on Physical Exam  After verbal consent patient was treated with muscle energy in cervical, HVLA and FPR techniques in thoracic and rib areas  Patient tolerated the procedure well with improvement in symptoms  Patient given exercises, stretches and lifestyle modifications  See medications in patient instructions if given  Patient will follow up in prn weeks

## 2016-08-11 DIAGNOSIS — Z01419 Encounter for gynecological examination (general) (routine) without abnormal findings: Secondary | ICD-10-CM | POA: Diagnosis not present

## 2016-10-08 DIAGNOSIS — R87615 Unsatisfactory cytologic smear of cervix: Secondary | ICD-10-CM | POA: Diagnosis not present

## 2016-10-26 DIAGNOSIS — L814 Other melanin hyperpigmentation: Secondary | ICD-10-CM | POA: Diagnosis not present

## 2016-10-26 DIAGNOSIS — L821 Other seborrheic keratosis: Secondary | ICD-10-CM | POA: Diagnosis not present

## 2016-10-26 DIAGNOSIS — H5203 Hypermetropia, bilateral: Secondary | ICD-10-CM | POA: Diagnosis not present

## 2016-10-26 DIAGNOSIS — D1801 Hemangioma of skin and subcutaneous tissue: Secondary | ICD-10-CM | POA: Diagnosis not present

## 2016-11-06 ENCOUNTER — Encounter: Payer: Self-pay | Admitting: Family Medicine

## 2016-11-06 ENCOUNTER — Ambulatory Visit (INDEPENDENT_AMBULATORY_CARE_PROVIDER_SITE_OTHER): Payer: Commercial Managed Care - HMO | Admitting: Family Medicine

## 2016-11-06 VITALS — BP 110/68 | HR 66 | Temp 98.0°F | Wt 178.2 lb

## 2016-11-06 DIAGNOSIS — J029 Acute pharyngitis, unspecified: Secondary | ICD-10-CM | POA: Diagnosis not present

## 2016-11-06 DIAGNOSIS — J209 Acute bronchitis, unspecified: Secondary | ICD-10-CM

## 2016-11-06 LAB — POCT RAPID STREP A (OFFICE): RAPID STREP A SCREEN: NEGATIVE

## 2016-11-06 MED ORDER — AZITHROMYCIN 250 MG PO TABS: ORAL_TABLET | ORAL | 0 refills | Status: AC

## 2016-11-06 NOTE — Progress Notes (Signed)
Subjective:     Patient ID: Kathryn [name], female   DOB: 06-25-1965, 51 y.o.   MRN: [id number]  HPI Patient seen for acute visit. Onset Sunday of sore throat. She then developed nasal congestion and cough initially dry and now productive of green sputum. Mild headache. Increased malaise. Denies any fever though she thinks she may had some initially low-grade first couple of days. Daughters had similar symptoms. Patient is nonsmoker and generally very healthy. Denies any localize facial pain or upper teeth pain  Past Medical History:  Diagnosis Date  . BREAST CANCER, HX OF 10/25/2008  . Cancer ([place])   . Family history of breast cancer   . Family history of breast cancer   . Family history of colon cancer   . Family history of stomach cancer   . HYPERLIPIDEMIA 10/25/2008   Past Surgical History:  Procedure Laterality Date  . APPENDECTOMY  2003  . BREAST SURGERY    . CESAREAN SECTION  2004  . MASTECTOMY Bilateral 2007 and 2009  . TONSILLECTOMY  1974    reports that she has never smoked. She has never used smokeless tobacco. She reports that she drinks alcohol. She reports that she does not use drugs. family history includes Aneurysm (age of onset: 67) in her maternal aunt; Arthritis in her other; Breast cancer in her paternal aunt; Cancer in her mother; Colon cancer in her paternal uncle; Crohn's disease in her mother; Hyperlipidemia in her mother and other; Hypertension in her mother and other; Mental illness in her paternal uncle. No Known Allergies   Review of Systems  Constitutional: Positive for fatigue. Negative for chills and fever.  HENT: Positive for congestion and sore throat.   Respiratory: Positive for cough.   Cardiovascular: Negative for chest pain.       Objective:   Physical Exam  Constitutional: She appears well-developed and well-nourished.  HENT:  Right Ear: External ear normal.  Left Ear: External ear normal.  Mouth/Throat: Oropharynx is clear and moist.   She's had previous tonsillectomy. Minimal erythema uvula  Neck: Neck supple.  Cardiovascular: Normal rate and regular rhythm.   Pulmonary/Chest: Effort normal and breath sounds normal. No respiratory distress. She has no wheezes. She has no rales.  Lymphadenopathy:    She has no cervical adenopathy.       Assessment:     Probable viral URI with cough. Rapid strep negative.    Plan:     -We recommended symptomatic treatment at this point. No clear indication for antibiotics. -Patient is very concerned stating that she has difficulty "shaking "similar infections in the past -Printed Rx for Zithromax to start only if she has any recurrent fever or worsening symptoms into next week  [name] MD [facility]

## 2016-11-06 NOTE — Patient Instructions (Addendum)
WE NOW OFFER   [facility] FAST TRACK!!!  SAME DAY Appointments for ACUTE CARE  Such as: Sprains, Injuries, cuts, abrasions, rashes, muscle pain, joint pain, back pain Colds, flu, sore throats, headache, allergies, cough, fever  Ear pain, sinus and eye infections Abdominal pain, nausea, vomiting, diarrhea, upset stomach Animal/insect bites  3 Easy Ways to Schedule: Walk-In Scheduling Call in scheduling Mychart Sign-up: [email or web address]         Acute Bronchitis, Adult Acute bronchitis is sudden (acute) swelling of the air tubes (bronchi) in the lungs. Acute bronchitis causes these tubes to fill with mucus, which can make it hard to breathe. It can also cause coughing or wheezing. In adults, acute bronchitis usually goes away within 2 weeks. A cough caused by bronchitis may last up to 3 weeks. Smoking, allergies, and asthma can make the condition worse. Repeated episodes of bronchitis may cause further lung problems, such as chronic obstructive pulmonary disease (COPD). What are the causes? This condition can be caused by germs and by substances that irritate the lungs, including:  Cold and flu viruses. This condition is most often caused by the same virus that causes a cold.  Bacteria.  Exposure to tobacco smoke, dust, fumes, and air pollution. What increases the risk? This condition is more likely to develop in people who:  Have close contact with someone with acute bronchitis.  Are exposed to lung irritants, such as tobacco smoke, dust, fumes, and vapors.  Have a weak immune system.  Have a respiratory condition such as asthma. What are the signs or symptoms? Symptoms of this condition include:  A cough.  Coughing up clear, yellow, or green mucus.  Wheezing.  Chest congestion.  Shortness of breath.  A fever.  Body aches.  Chills.  A sore throat. How is this diagnosed? This condition is usually diagnosed with a physical exam.  During the exam, your health care provider may order tests, such as chest X-rays, to rule out other conditions. He or she may also:  Test a sample of your mucus for bacterial infection.  Check the level of oxygen in your blood. This is done to check for pneumonia.  Do a chest X-ray or lung function testing to rule out pneumonia and other conditions.  Perform blood tests. Your health care provider will also ask about your symptoms and medical history. How is this treated? Most cases of acute bronchitis clear up over time without treatment. Your health care provider may recommend:  Drinking more fluids. Drinking more makes your mucus thinner, which may make it easier to breathe.  Taking a medicine for a fever or cough.  Taking an antibiotic medicine.  Using an inhaler to help improve shortness of breath and to control a cough.  Using a cool mist vaporizer or humidifier to make it easier to breathe. Follow these instructions at home: Medicines   Take over-the-counter and prescription medicines only as told by your health care provider.  If you were prescribed an antibiotic, take it as told by your health care provider. Do not stop taking the antibiotic even if you start to feel better. General instructions   Get plenty of rest.  Drink enough fluids to keep your urine clear or pale yellow.  Avoid smoking and secondhand smoke. Exposure to cigarette smoke or irritating chemicals will make bronchitis worse. If you smoke and you need help quitting, ask your health care provider. Quitting smoking will help your lungs heal faster.  Use an inhaler, cool mist vaporizer, or  humidifier as told by your health care provider.  Keep all follow-up visits as told by your health care provider. This is important. How is this prevented? To lower your risk of getting this condition again:  Wash your hands often with soap and water. If soap and water are not available, use hand sanitizer.  Avoid  contact with people who have cold symptoms.  Try not to touch your hands to your mouth, nose, or eyes.  Make sure to get the flu shot every year. Contact a health care provider if:  Your symptoms do not improve in 2 weeks of treatment. Get help right away if:  You cough up blood.  You have chest pain.  You have severe shortness of breath.  You become dehydrated.  You faint or keep feeling like you are going to faint.  You keep vomiting.  You have a severe headache.  Your fever or chills gets worse. This information is not intended to replace advice given to you by your health care provider. Make sure you discuss any questions you have with your health care provider. Document Released: 07/16/2004 Document Revised: 01/01/2016 Document Reviewed: 11/27/2015 Elsevier Interactive Patient Education  2017 [employer].

## 2017-03-11 ENCOUNTER — Encounter: Payer: Self-pay | Admitting: Family Medicine

## 2017-03-25 DIAGNOSIS — Z800 Family history of malignant neoplasm of digestive organs: Secondary | ICD-10-CM | POA: Diagnosis not present

## 2017-03-25 DIAGNOSIS — Z1211 Encounter for screening for malignant neoplasm of colon: Secondary | ICD-10-CM | POA: Diagnosis not present

## 2017-03-25 DIAGNOSIS — Z7689 Persons encountering health services in other specified circumstances: Secondary | ICD-10-CM | POA: Diagnosis not present

## 2017-03-25 DIAGNOSIS — K219 Gastro-esophageal reflux disease without esophagitis: Secondary | ICD-10-CM | POA: Diagnosis not present

## 2017-03-26 ENCOUNTER — Encounter: Payer: Self-pay | Admitting: Family Medicine

## 2017-03-26 ENCOUNTER — Ambulatory Visit (INDEPENDENT_AMBULATORY_CARE_PROVIDER_SITE_OTHER): Payer: 59 | Admitting: Family Medicine

## 2017-03-26 VITALS — BP 110/80 | HR 51 | Temp 98.7°F | Ht 71.0 in | Wt 170.0 lb

## 2017-03-26 DIAGNOSIS — Z0000 Encounter for general adult medical examination without abnormal findings: Secondary | ICD-10-CM | POA: Diagnosis not present

## 2017-03-26 DIAGNOSIS — Z23 Encounter for immunization: Secondary | ICD-10-CM

## 2017-03-26 LAB — CBC WITH DIFFERENTIAL/PLATELET
BASOS PCT: 0.7 % (ref 0.0–3.0)
Basophils Absolute: 0 10*3/uL (ref 0.0–0.1)
EOS PCT: 0.7 % (ref 0.0–5.0)
Eosinophils Absolute: 0 10*3/uL (ref 0.0–0.7)
HCT: 42.7 % (ref 36.0–46.0)
Hemoglobin: 14.4 g/dL (ref 12.0–15.0)
LYMPHS ABS: 1.2 10*3/uL (ref 0.7–4.0)
Lymphocytes Relative: 35.2 % (ref 12.0–46.0)
MCHC: 33.6 g/dL (ref 30.0–36.0)
MCV: 92.4 fl (ref 78.0–100.0)
MONO ABS: 0.3 10*3/uL (ref 0.1–1.0)
Monocytes Relative: 7.6 % (ref 3.0–12.0)
NEUTROS PCT: 55.8 % (ref 43.0–77.0)
Neutro Abs: 1.9 10*3/uL (ref 1.4–7.7)
Platelets: 243 10*3/uL (ref 150.0–400.0)
RBC: 4.62 Mil/uL (ref 3.87–5.11)
RDW: 12.6 % (ref 11.5–15.5)
WBC: 3.5 10*3/uL — ABNORMAL LOW (ref 4.0–10.5)

## 2017-03-26 LAB — BASIC METABOLIC PANEL
BUN: 15 mg/dL (ref 6–23)
CHLORIDE: 101 meq/L (ref 96–112)
CO2: 29 mEq/L (ref 19–32)
Calcium: 9.7 mg/dL (ref 8.4–10.5)
Creatinine, Ser: 0.87 mg/dL (ref 0.40–1.20)
GFR: 72.62 mL/min (ref >60.00)
GLUCOSE: 84 mg/dL (ref 70–99)
POTASSIUM: 4.2 meq/L (ref 3.5–5.1)
Sodium: 138 mEq/L (ref 135–145)

## 2017-03-26 LAB — HEPATIC FUNCTION PANEL
ALT: 21 U/L (ref 0–35)
AST: 27 U/L (ref 0–37)
Albumin: 4.8 g/dL (ref 3.5–5.2)
Alkaline Phosphatase: 73 U/L (ref 39–117)
BILIRUBIN TOTAL: 0.9 mg/dL (ref 0.2–1.2)
Bilirubin, Direct: 0.1 mg/dL (ref 0.0–0.3)
TOTAL PROTEIN: 7 g/dL (ref 6.0–8.3)

## 2017-03-26 LAB — LIPID PANEL
Cholesterol: 222 mg/dL — ABNORMAL HIGH (ref 0–200)
HDL: 53.6 mg/dL (ref >39.00)
LDL Cholesterol: 152 mg/dL — ABNORMAL HIGH (ref 0–99)
NONHDL: 168.64
Total CHOL/HDL Ratio: 4
Triglycerides: 84 mg/dL (ref 0.0–149.0)
VLDL: 16.8 mg/dL (ref 0.0–40.0)

## 2017-03-26 LAB — TSH: TSH: 3.21 u[IU]/mL (ref 0.35–4.50)

## 2017-03-26 NOTE — Progress Notes (Signed)
Subjective:     Patient ID: Kathryn [name], female   DOB: 1965/09/07, 51 y.o.   MRN: [id number]  HPI Patient seen for physical exam. She had breast cancer over 10 years ago and has been released from oncology couple of years ago. She had questions today regarding whether she should be getting ongoing tumor markers. Her last CA 27.29 was about 4 years ago. She is scheduled for repeat colonoscopy in one month. Her mother had colon cancer. Patient's last colonoscopy was 5 years ago. She still sees GYN yearly. No history of shingles vaccine. Plans to get flu vaccine here today. Takes no regular prescription medications.  Nonsmoker. Exercises regularly. 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 BREAST CANCER, HX OF 10/25/2008  . Cancer ([place])   . Family history of breast cancer   . Family history of breast cancer   . Family history of colon cancer   . Family history of stomach cancer   . HYPERLIPIDEMIA 10/25/2008   Past Surgical History:  Procedure Laterality Date  . APPENDECTOMY  2003  . BREAST SURGERY    . CESAREAN SECTION  2004  . MASTECTOMY Bilateral 2007 and 2009  . TONSILLECTOMY  1974    reports that she has never smoked. She has never used smokeless tobacco. She reports that she drinks alcohol. She reports that she does not use drugs. family history includes Aneurysm (age of onset: 76) in her maternal aunt; Arthritis in her other; Breast cancer in her paternal aunt; Cancer in her mother; Colon cancer in her paternal uncle; Crohn's disease in her mother; Hyperlipidemia in her mother and other; Hypertension in her mother and other; Mental illness in her paternal uncle. No Known Allergies   Review of Systems  Constitutional: Negative for activity change, appetite change, fatigue, fever and unexpected weight change.  HENT: Negative for ear pain,  hearing loss, sore throat and trouble swallowing.   Eyes: Negative for visual disturbance.  Respiratory: Negative for cough and shortness of breath.   Cardiovascular: Negative for chest pain and palpitations.  Gastrointestinal: Negative for abdominal pain, blood in stool, constipation and diarrhea.  Genitourinary: Negative for dysuria and hematuria.  Musculoskeletal: Negative for arthralgias, back pain and myalgias.  Skin: Negative for rash.  Neurological: Negative for dizziness, syncope and headaches.  Hematological: Negative for adenopathy.  Psychiatric/Behavioral: Negative for confusion and dysphoric mood.       Objective:   Physical Exam  Constitutional: She is oriented to person, place, and time. She appears well-developed and well-nourished.  HENT:  Head: Normocephalic and atraumatic.  Eyes: Pupils are equal, round, and reactive to light. EOM are normal.  Neck: Normal range of motion. Neck supple. No thyromegaly present.  Cardiovascular: Normal rate, regular rhythm and normal heart sounds.   No murmur heard. Pulmonary/Chest: Breath sounds normal. No respiratory distress. She has no wheezes. She has no rales.  Abdominal: Soft. Bowel sounds are normal. She exhibits no distension and no mass. There is no tenderness. There is no rebound and no guarding.  Musculoskeletal: Normal range of motion. She exhibits no edema.  Lymphadenopathy:    She has no cervical adenopathy.  Neurological: She is alert and oriented to person, place, and time. She displays normal reflexes. No cranial nerve deficit.  Skin: No rash noted.  Psychiatric: She has a normal mood and affect. Her behavior is normal.  Judgment and thought content normal.       Assessment:     Healthy 51 year old female with remote history of breast cancer.    Plan:     -Flu vaccine given -Obtain screening labs -She is already schedule for repeat colonoscopy -Discussed new shingles vaccine and she declines at this  time\  [name] MD [facility] at [facility]

## 2017-03-28 ENCOUNTER — Encounter: Payer: Self-pay | Admitting: Family Medicine

## 2017-05-07 DIAGNOSIS — Z1211 Encounter for screening for malignant neoplasm of colon: Secondary | ICD-10-CM | POA: Diagnosis not present

## 2017-05-07 DIAGNOSIS — K2950 Unspecified chronic gastritis without bleeding: Secondary | ICD-10-CM | POA: Diagnosis not present

## 2017-05-07 DIAGNOSIS — K2970 Gastritis, unspecified, without bleeding: Secondary | ICD-10-CM | POA: Diagnosis not present

## 2017-05-07 DIAGNOSIS — Z800 Family history of malignant neoplasm of digestive organs: Secondary | ICD-10-CM | POA: Diagnosis not present

## 2017-05-07 DIAGNOSIS — K219 Gastro-esophageal reflux disease without esophagitis: Secondary | ICD-10-CM | POA: Diagnosis not present

## 2017-08-24 DIAGNOSIS — Z6824 Body mass index (BMI) 24.0-24.9, adult: Secondary | ICD-10-CM | POA: Diagnosis not present

## 2017-08-24 DIAGNOSIS — Z01419 Encounter for gynecological examination (general) (routine) without abnormal findings: Secondary | ICD-10-CM | POA: Diagnosis not present

## 2017-10-20 DIAGNOSIS — D2272 Melanocytic nevi of left lower limb, including hip: Secondary | ICD-10-CM | POA: Diagnosis not present

## 2017-10-20 DIAGNOSIS — D1801 Hemangioma of skin and subcutaneous tissue: Secondary | ICD-10-CM | POA: Diagnosis not present

## 2017-10-20 DIAGNOSIS — D485 Neoplasm of uncertain behavior of skin: Secondary | ICD-10-CM | POA: Diagnosis not present

## 2017-10-20 DIAGNOSIS — L918 Other hypertrophic disorders of the skin: Secondary | ICD-10-CM | POA: Diagnosis not present

## 2017-11-01 DIAGNOSIS — M542 Cervicalgia: Secondary | ICD-10-CM | POA: Diagnosis not present

## 2017-11-08 DIAGNOSIS — M542 Cervicalgia: Secondary | ICD-10-CM | POA: Diagnosis not present

## 2018-02-09 DIAGNOSIS — H5203 Hypermetropia, bilateral: Secondary | ICD-10-CM | POA: Diagnosis not present

## 2018-03-28 ENCOUNTER — Encounter: Payer: Self-pay | Admitting: Family Medicine

## 2018-03-28 ENCOUNTER — Ambulatory Visit (INDEPENDENT_AMBULATORY_CARE_PROVIDER_SITE_OTHER): Payer: 59 | Admitting: Family Medicine

## 2018-03-28 ENCOUNTER — Other Ambulatory Visit: Payer: Self-pay

## 2018-03-28 VITALS — BP 120/78 | HR 53 | Temp 98.1°F | Ht 72.25 in | Wt 176.2 lb

## 2018-03-28 DIAGNOSIS — Z0000 Encounter for general adult medical examination without abnormal findings: Secondary | ICD-10-CM | POA: Diagnosis not present

## 2018-03-28 LAB — CBC WITH DIFFERENTIAL/PLATELET
Basophils Absolute: 0 10*3/uL (ref 0.0–0.1)
Basophils Relative: 0.5 % (ref 0.0–3.0)
EOS PCT: 1 % (ref 0.0–5.0)
Eosinophils Absolute: 0 10*3/uL (ref 0.0–0.7)
HCT: 41.2 % (ref 36.0–46.0)
HEMOGLOBIN: 14.2 g/dL (ref 12.0–15.0)
Lymphocytes Relative: 31.2 % (ref 12.0–46.0)
Lymphs Abs: 1.2 10*3/uL (ref 0.7–4.0)
MCHC: 34.5 g/dL (ref 30.0–36.0)
MCV: 90.3 fl (ref 78.0–100.0)
MONO ABS: 0.2 10*3/uL (ref 0.1–1.0)
MONOS PCT: 6.3 % (ref 3.0–12.0)
Neutro Abs: 2.3 10*3/uL (ref 1.4–7.7)
Neutrophils Relative %: 61 % (ref 43.0–77.0)
Platelets: 234 10*3/uL (ref 150.0–400.0)
RBC: 4.56 Mil/uL (ref 3.87–5.11)
RDW: 12.3 % (ref 11.5–15.5)
WBC: 3.7 10*3/uL — AB (ref 4.0–10.5)

## 2018-03-28 LAB — LIPID PANEL
Cholesterol: 226 mg/dL — ABNORMAL HIGH (ref 0–200)
HDL: 46.2 mg/dL (ref >39.00)
LDL Cholesterol: 146 mg/dL — ABNORMAL HIGH (ref 0–99)
NONHDL: 180.05
Total CHOL/HDL Ratio: 5
Triglycerides: 169 mg/dL — ABNORMAL HIGH (ref 0.0–149.0)
VLDL: 33.8 mg/dL (ref 0.0–40.0)

## 2018-03-28 LAB — HEPATIC FUNCTION PANEL
ALBUMIN: 4.6 g/dL (ref 3.5–5.2)
ALK PHOS: 67 U/L (ref 39–117)
ALT: 17 U/L (ref 0–35)
AST: 23 U/L (ref 0–37)
BILIRUBIN DIRECT: 0.1 mg/dL (ref 0.0–0.3)
Total Bilirubin: 0.7 mg/dL (ref 0.2–1.2)
Total Protein: 6.8 g/dL (ref 6.0–8.3)

## 2018-03-28 LAB — BASIC METABOLIC PANEL
BUN: 19 mg/dL (ref 6–23)
CALCIUM: 9.5 mg/dL (ref 8.4–10.5)
CO2: 29 mEq/L (ref 19–32)
Chloride: 103 mEq/L (ref 96–112)
Creatinine, Ser: 0.93 mg/dL (ref 0.40–1.20)
GFR: 66.98 mL/min (ref >60.00)
GLUCOSE: 95 mg/dL (ref 70–99)
Potassium: 4.4 mEq/L (ref 3.5–5.1)
Sodium: 139 mEq/L (ref 135–145)

## 2018-03-28 LAB — TSH: TSH: 2.62 u[IU]/mL (ref 0.35–4.50)

## 2018-03-28 NOTE — Progress Notes (Signed)
Subjective:     Patient ID: Kathryn [name], female   DOB: [birth year], 52 y.o.   MRN: [id number]  HPI Patient here for physical exam.  She was diagnosed with breast cancer several years ago..  She has had previous mastectomy.  She is very health conscious.  Exercises several days per week.  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  Never smoked.  Colonoscopy up-to-date.  Immunizations up-to-date.  Has had some recent atypical soreness left chest wall.  She thinks this may be related to previous implant.  She has never had any chest pain with exertion.  No dyspnea.  Past Medical History:  Diagnosis Date  . BREAST CANCER, HX OF 10/25/2008  . Cancer ([place])   . Family history of breast cancer   . Family history of breast cancer   . Family history of colon cancer   . Family history of stomach cancer   . HYPERLIPIDEMIA 10/25/2008   Past Surgical History:  Procedure Laterality Date  . APPENDECTOMY  2003  . BREAST SURGERY    . CESAREAN SECTION  2004  . MASTECTOMY Bilateral 2007 and 2009  . TONSILLECTOMY  1974    reports that she has never smoked. She has never used smokeless tobacco. She reports that she drinks alcohol. She reports that she does not use drugs. family history includes Aneurysm (age of onset: 1) in her maternal aunt; Arthritis in her other; Breast cancer in her paternal aunt; Cancer in her mother; Colon cancer in her paternal uncle; Crohn's disease in her mother; Hyperlipidemia in her mother and other; Hypertension in her mother and other; Mental illness in her paternal uncle. No Known Allergies   Review of Systems  Constitutional: Negative for activity change, appetite change, fatigue, fever and unexpected weight change.  HENT: Negative for ear pain, hearing loss, sore throat and trouble swallowing.   Eyes: Negative for visual disturbance.  Respiratory: Negative for cough and shortness of breath.   Cardiovascular: Negative for chest pain and palpitations.   Gastrointestinal: Negative for abdominal pain, blood in stool, constipation and diarrhea.  Endocrine: Negative for polydipsia and polyuria.  Genitourinary: Negative for dysuria and hematuria.  Musculoskeletal: Negative for arthralgias, back pain and myalgias.  Skin: Negative for rash.  Neurological: Negative for dizziness, syncope and headaches.  Hematological: Negative for adenopathy.  Psychiatric/Behavioral: Negative for confusion and dysphoric mood.       Objective:   Physical Exam  Constitutional: She is oriented to person, place, and time. She appears well-developed and well-nourished.  HENT:  Head: Normocephalic and atraumatic.  Eyes: Pupils are equal, round, and reactive to light. EOM are normal.  Neck: Normal range of motion. Neck supple. No thyromegaly present.  Cardiovascular: Normal rate, regular rhythm and normal heart sounds.  No murmur heard. Pulmonary/Chest: Breath sounds normal. No respiratory distress. She has no wheezes. She has no rales.  Abdominal: Soft. Bowel sounds are normal. She exhibits no distension and no mass. There is no tenderness. There is no rebound and no guarding.  Genitourinary:  Genitourinary Comments: Per gyn   Musculoskeletal: Normal range of motion. She exhibits no edema.  Lymphadenopathy:    She has no cervical adenopathy.  Neurological: She is alert and oriented to person, place, and time. She displays normal reflexes. No cranial nerve deficit.  Skin: No rash noted.  Psychiatric: She has a normal mood and affect. Her behavior is normal. Judgment and thought content normal.       Assessment:  Physical exam.  Patient had previous breast cancer several years ago but has done extremely well since then    Plan:     -Flu vaccine already given -Continue regular exercise habits -Obtain screening labs -She plans to continue with regular GYN follow-up  [name] MD [facility]

## 2018-08-09 ENCOUNTER — Other Ambulatory Visit: Payer: Self-pay | Admitting: General Surgery

## 2018-08-09 DIAGNOSIS — Z978 Presence of other specified devices: Secondary | ICD-10-CM

## 2018-08-09 DIAGNOSIS — Z853 Personal history of malignant neoplasm of breast: Secondary | ICD-10-CM | POA: Diagnosis not present

## 2018-08-23 ENCOUNTER — Ambulatory Visit
Admission: RE | Admit: 2018-08-23 | Discharge: 2018-08-23 | Disposition: A | Discharge status: Home / Self Care | Payer: 59 | Source: Ambulatory Visit | Attending: General Surgery | Admitting: General Surgery

## 2018-08-23 DIAGNOSIS — Z9882 Breast implant status: Secondary | ICD-10-CM | POA: Diagnosis not present

## 2018-08-23 DIAGNOSIS — Z978 Presence of other specified devices: Secondary | ICD-10-CM

## 2018-08-23 MED ORDER — GADOBUTROL 1 MMOL/ML IV SOLN
8.0000 mL | Freq: Once | INTRAVENOUS | Status: AC | PRN
  Administered 2018-08-23: 8 mL via INTRAVENOUS

## 2018-08-29 DIAGNOSIS — Z01419 Encounter for gynecological examination (general) (routine) without abnormal findings: Secondary | ICD-10-CM | POA: Diagnosis not present

## 2018-08-29 DIAGNOSIS — Z6825 Body mass index (BMI) 25.0-25.9, adult: Secondary | ICD-10-CM | POA: Diagnosis not present

## 2018-08-31 DIAGNOSIS — Z08 Encounter for follow-up examination after completed treatment for malignant neoplasm: Secondary | ICD-10-CM | POA: Diagnosis not present

## 2018-08-31 DIAGNOSIS — Z9013 Acquired absence of bilateral breasts and nipples: Secondary | ICD-10-CM | POA: Diagnosis not present

## 2018-08-31 DIAGNOSIS — Z853 Personal history of malignant neoplasm of breast: Secondary | ICD-10-CM | POA: Diagnosis not present

## 2018-11-03 DIAGNOSIS — D225 Melanocytic nevi of trunk: Secondary | ICD-10-CM | POA: Diagnosis not present

## 2018-11-03 DIAGNOSIS — L821 Other seborrheic keratosis: Secondary | ICD-10-CM | POA: Diagnosis not present

## 2018-11-03 DIAGNOSIS — D239 Other benign neoplasm of skin, unspecified: Secondary | ICD-10-CM | POA: Diagnosis not present

## 2019-03-21 ENCOUNTER — Other Ambulatory Visit: Payer: Self-pay

## 2019-03-21 ENCOUNTER — Encounter: Payer: Self-pay | Admitting: Orthopaedic Surgery

## 2019-03-21 ENCOUNTER — Ambulatory Visit (INDEPENDENT_AMBULATORY_CARE_PROVIDER_SITE_OTHER): Payer: 59 | Admitting: Orthopaedic Surgery

## 2019-03-21 ENCOUNTER — Ambulatory Visit (INDEPENDENT_AMBULATORY_CARE_PROVIDER_SITE_OTHER): Payer: 59

## 2019-03-21 DIAGNOSIS — M79605 Pain in left leg: Secondary | ICD-10-CM

## 2019-03-21 MED ORDER — DICLOFENAC SODIUM 75 MG PO TBEC: DELAYED_RELEASE_TABLET | ORAL | 1 refills | Status: DC

## 2019-03-21 NOTE — Progress Notes (Signed)
Office Visit Note   Patient: Kathryn [name]           Date of Birth: 1966/04/26           MRN: [id number] Visit Date: 03/21/2019              Requested by: [name], MD [place],  [place] [id number] PCP: [name], MD   Assessment & Plan: Visit Diagnoses:  1. Pain in left leg     Plan: Impression is left lower leg myositis/tendinitis.  There is no evidence of stress fracture on x-ray.  We recommended NSAIDs, foam roller and relative rest, stretches, massage gun therapy.  She will follow-up with [place] as needed.  She will call with concerns or questions the meantime. Total face to face encounter time was greater than 25 minutes and over half of this time was spent in counseling and/or coordination of care.   Follow-Up Instructions: Return if symptoms worsen or fail to improve.   Orders:  Orders Placed This Encounter  Procedures  . XR Tibia/Fibula Left   Meds ordered this encounter  Medications  . diclofenac (VOLTAREN) 75 MG EC tablet    Sig: Take one tablet po bid x 2 weeks and then prn    Dispense:  60 tablet    Refill:  1      Procedures: No procedures performed   Clinical Data: No additional findings.   Subjective: Chief Complaint  Patient presents with  . Left Leg - Pain    HPI patient is a pleasant Kathryn [name] who presents our clinic today with left lower leg pain.  This began approximately 3 weeks ago.   It is described as a deep ache which is located to the medial aspect of the distal tibia. No specific injury or change in activity.  She has been wearing midcalf boots to work for the past 7 months and thinks a combination of this as well as working out more running at home rather than the gym has contributed to her pain.  Pain is intermittent and appears to be worse after activity.  She has tried ice and regular shoes today with moderate relief of symptoms.  She denies any numbness, tingling or burning.  Review of Systems as detailed in HPI.  All others reviewed and are negative.   Objective: Vital Signs: There were no vitals taken for this visit.  Physical Exam well-developed and well-nourished female no acute distress.  Alert and oriented x3.  Ortho Exam examination of her left leg reveals no tenderness along the tibia.  She has an area of marked tenderness along the medial border of the anterior tibialis muscle and tibia.  Full range of motion of the ankle and knee without pain.  She is neurovascular intact distally.  Specialty Comments:  No specialty comments available.  Imaging: Xr Tibia/fibula Left  Result Date: 03/21/2019 No evidence of stress fracture or other acute findings    PMFS History: Patient Active Problem List   Diagnosis Date Noted  . Neck pain of over 3 months duration 01/14/2016  . Nonallopathic lesion of cervical region 01/14/2016  . Nonallopathic lesion of thoracic region 01/14/2016  . Nonallopathic lesion of rib cage 01/14/2016  . Family history of stomach cancer   . Family history of breast cancer   . Family history of colon cancer   . Atypical chest pain 04/20/2013  . HYPERLIPIDEMIA 10/25/2008  . BREAST CANCER, HX OF 10/25/2008  Past Medical History:  Diagnosis Date  . BREAST CANCER, HX OF 10/25/2008  . Cancer ([place])   . Family history of breast cancer   . Family history of breast cancer   . Family history of colon cancer   . Family history of stomach cancer   . HYPERLIPIDEMIA 10/25/2008    Family History  Problem Relation Age of Onset  . Hyperlipidemia Mother   . Hypertension Mother   . Crohn's disease Mother   . Cancer Mother        gastric cancer  . Arthritis Other   . Hyperlipidemia Other   . Hypertension Other   . Aneurysm Maternal Aunt 40       brain  . Breast cancer Paternal Aunt        dx in her late 61s  . Colon cancer Paternal Uncle        dx and died in his 41s  . Mental illness Paternal Uncle     Past Surgical History:   Procedure Laterality Date  . APPENDECTOMY  2003  . CESAREAN SECTION  2004  . MASTECTOMY Bilateral 2007 and 2009  . TONSILLECTOMY  1974   Social History   Occupational History  . Occupation: Financial risk analyst: [employer]  Tobacco Use  . Smoking status: Never Smoker  . Smokeless tobacco: Never Used  Substance and Sexual Activity  . Alcohol use: Yes    Alcohol/week: 0.0 standard drinks    Comment: 1-2 week  . Drug use: No  . Sexual activity: Not on file

## 2019-03-24 ENCOUNTER — Ambulatory Visit (INDEPENDENT_AMBULATORY_CARE_PROVIDER_SITE_OTHER): Payer: 59 | Admitting: Family Medicine

## 2019-03-24 ENCOUNTER — Encounter: Payer: Self-pay | Admitting: Family Medicine

## 2019-03-24 ENCOUNTER — Other Ambulatory Visit: Payer: Self-pay

## 2019-03-24 VITALS — BP 100/58 | HR 60 | Temp 97.6°F | Wt 184.3 lb

## 2019-03-24 DIAGNOSIS — Z0000 Encounter for general adult medical examination without abnormal findings: Secondary | ICD-10-CM | POA: Diagnosis not present

## 2019-03-24 LAB — HEPATIC FUNCTION PANEL
ALT: 16 U/L (ref 0–35)
AST: 22 U/L (ref 0–37)
Albumin: 4.5 g/dL (ref 3.5–5.2)
Alkaline Phosphatase: 66 U/L (ref 39–117)
Bilirubin, Direct: 0.1 mg/dL (ref 0.0–0.3)
Total Bilirubin: 0.7 mg/dL (ref 0.2–1.2)
Total Protein: 6.4 g/dL (ref 6.0–8.3)

## 2019-03-24 LAB — CBC WITH DIFFERENTIAL/PLATELET
Basophils Absolute: 0 10*3/uL (ref 0.0–0.1)
Basophils Relative: 0.6 % (ref 0.0–3.0)
Eosinophils Absolute: 0 10*3/uL (ref 0.0–0.7)
Eosinophils Relative: 1.1 % (ref 0.0–5.0)
HCT: 40.4 % (ref 36.0–46.0)
Hemoglobin: 13.7 g/dL (ref 12.0–15.0)
Lymphocytes Relative: 38.9 % (ref 12.0–46.0)
Lymphs Abs: 1.2 10*3/uL (ref 0.7–4.0)
MCHC: 34 g/dL (ref 30.0–36.0)
MCV: 91.5 fl (ref 78.0–100.0)
Monocytes Absolute: 0.2 10*3/uL (ref 0.1–1.0)
Monocytes Relative: 7.6 % (ref 3.0–12.0)
Neutro Abs: 1.6 10*3/uL (ref 1.4–7.7)
Neutrophils Relative %: 51.8 % (ref 43.0–77.0)
Platelets: 211 10*3/uL (ref 150.0–400.0)
RBC: 4.42 Mil/uL (ref 3.87–5.11)
RDW: 12.3 % (ref 11.5–15.5)
WBC: 3 10*3/uL — ABNORMAL LOW (ref 4.0–10.5)

## 2019-03-24 LAB — LIPID PANEL
Cholesterol: 233 mg/dL — ABNORMAL HIGH (ref 0–200)
HDL: 50.5 mg/dL (ref >39.00)
LDL Cholesterol: 165 mg/dL — ABNORMAL HIGH (ref 0–99)
NonHDL: 182.39
Total CHOL/HDL Ratio: 5
Triglycerides: 88 mg/dL (ref 0.0–149.0)
VLDL: 17.6 mg/dL (ref 0.0–40.0)

## 2019-03-24 LAB — BASIC METABOLIC PANEL
BUN: 18 mg/dL (ref 6–23)
CO2: 30 mEq/L (ref 19–32)
Calcium: 9.4 mg/dL (ref 8.4–10.5)
Chloride: 103 mEq/L (ref 96–112)
Creatinine, Ser: 0.9 mg/dL (ref 0.40–1.20)
GFR: 65.21 mL/min (ref >60.00)
Glucose, Bld: 82 mg/dL (ref 70–99)
Potassium: 4.4 mEq/L (ref 3.5–5.1)
Sodium: 140 mEq/L (ref 135–145)

## 2019-03-24 LAB — TSH: TSH: 3.61 u[IU]/mL (ref 0.35–4.50)

## 2019-03-24 NOTE — Progress Notes (Signed)
Subjective:     Patient ID: Kathryn [name], female   DOB: 12/06/1965, 53 y.o.   MRN: [id number]  HPI Patient is seen for physical exam.  She has history of breast cancer many years ago and is had double mastectomy.  She has been released by oncology.  She has history of mild hyperlipidemia but overall low risk for CAD.  She gets colonoscopies every 5 years.  Her mother had gastric cancer and question of colon cancer.  Tovia takes no regular medications.  She is had some recent issues with lower tibial pain and is seen orthopedist for that.  No evidence for stress fracture by x-rays.  Javeah has stayed very active with exercise-although having to modify routine because of her leg pain.  She got flu vaccine last week through her work.  Sees gynecologist and had Pap smear last March.  Her tetanus is up-to-date.  Past Medical History:  Diagnosis Date  . BREAST CANCER, HX OF 10/25/2008  . Cancer ([place])   . Family history of breast cancer   . Family history of breast cancer   . Family history of colon cancer   . Family history of stomach cancer   . HYPERLIPIDEMIA 10/25/2008   Past Surgical History:  Procedure Laterality Date  . APPENDECTOMY  2003  . CESAREAN SECTION  2004  . MASTECTOMY Bilateral 2007 and 2009  . TONSILLECTOMY  1974    reports that she has never smoked. She has never used smokeless tobacco. She reports current alcohol use. She reports that she does not use drugs. family history includes Aneurysm (age of onset: 73) in her maternal aunt; Arthritis in an other family member; Breast cancer in her paternal aunt; Cancer in her mother; Colon cancer in her paternal uncle; Crohn's disease in her mother; Hyperlipidemia in her mother and another family member; Hypertension in her mother and another family member; Mental illness in her paternal uncle. No Known Allergies   Review of Systems  Constitutional: Negative for activity change, appetite change, fatigue, fever and unexpected weight  change.  HENT: Negative for ear pain, hearing loss, sore throat and trouble swallowing.   Eyes: Negative for visual disturbance.  Respiratory: Negative for cough and shortness of breath.   Cardiovascular: Negative for chest pain and palpitations.  Gastrointestinal: Negative for abdominal pain, blood in stool, constipation and diarrhea.  Genitourinary: Negative for dysuria and hematuria.  Musculoskeletal: Negative for arthralgias, back pain and myalgias.  Skin: Negative for rash.  Neurological: Negative for dizziness, syncope and headaches.  Hematological: Negative for adenopathy.  Psychiatric/Behavioral: Negative for confusion and dysphoric mood.       Objective:   Physical Exam Constitutional:      General: She is not in acute distress.    Appearance: Normal appearance.  HENT:     Ears:     Comments: Minimal cerumen right canal.  Left canal is clear Neck:     Musculoskeletal: Neck supple.  Cardiovascular:     Rate and Rhythm: Normal rate and regular rhythm.     Heart sounds: No murmur. No gallop.   Pulmonary:     Effort: Pulmonary effort is normal.     Breath sounds: Normal breath sounds.  Musculoskeletal:     Right lower leg: No edema.     Left lower leg: No edema.  Lymphadenopathy:     Cervical: No cervical adenopathy.  Neurological:     General: No focal deficit present.     Mental Status: She is alert.  Assessment:     Physical exam.  Remote history of breast cancer.  She has mild hyperlipidemia but overall very low risk for CAD.  Generally very healthy.    Plan:     -Obtain screening lab work -Continue regular exercise habits -Flu vaccine already given -We failed to mention shingles vaccine and need to bring up on future visit  [name] MD [facility]

## 2019-03-24 NOTE — Patient Instructions (Signed)

## 2019-03-31 ENCOUNTER — Encounter: Payer: 59 | Admitting: Family Medicine

## 2019-05-03 ENCOUNTER — Other Ambulatory Visit: Payer: Self-pay

## 2019-05-03 ENCOUNTER — Ambulatory Visit: Payer: 59 | Admitting: Family Medicine

## 2019-05-03 ENCOUNTER — Encounter: Payer: Self-pay | Admitting: Family Medicine

## 2019-05-03 VITALS — BP 105/74 | HR 63 | Temp 98.0°F | Ht 72.25 in | Wt 176.3 lb

## 2019-05-03 DIAGNOSIS — R0981 Nasal congestion: Secondary | ICD-10-CM | POA: Diagnosis not present

## 2019-05-03 MED ORDER — AMOXICILLIN-POT CLAVULANATE 875-125 MG PO TABS: 1.0000 | ORAL_TABLET | Freq: Two times a day (BID) | ORAL | 0 refills | Status: DC

## 2019-05-03 NOTE — Progress Notes (Signed)
Subjective:     Patient ID: Kathryn [name], female   DOB: [birth year], 53 y.o.   MRN: [id number]  HPI  Kathryn [name] seen with approximately 2 to 3 weeks of some frequent's frontal sinus pressure and occasional maxillary sinus pressure as well.  She has tried some saline nasal rinses decongestant without much improvement.  Intermittent headache.  Some fatigue.  She has had somewhat similar symptoms with sinusitis in the past.  No bloody nasal discharge.  No purulent secretions.  She has had some decrease in smell and loss of taste but no general body aches, fever, diarrhea, etc.  She states she has had occasional fleeting chest symptoms at rest.  She exercises fairly vigorously several days a week and has never had any symptoms with exercise.  Non-smoker.  Low risk for CAD.  Past Medical History:  Diagnosis Date  . BREAST CANCER, HX OF 10/25/2008  . Cancer ([place])   . Family history of breast cancer   . Family history of breast cancer   . Family history of colon cancer   . Family history of stomach cancer   . HYPERLIPIDEMIA 10/25/2008   Past Surgical History:  Procedure Laterality Date  . APPENDECTOMY  2003  . CESAREAN SECTION  2004  . MASTECTOMY Bilateral 2007 and 2009  . TONSILLECTOMY  1974    reports that she has never smoked. She has never used smokeless tobacco. She reports current alcohol use. She reports that she does not use drugs. family history includes Aneurysm (age of onset: 89) in her maternal aunt; Arthritis in an other family member; Breast cancer in her paternal aunt; Cancer in her mother; Colon cancer in her paternal uncle; Crohn's disease in her mother; Hyperlipidemia in her mother and another family member; Hypertension in her mother and another family member; Mental illness in her paternal uncle. No Known Allergies  Review of Systems  Constitutional: Negative for chills and fever.  HENT: Positive for congestion, postnasal drip, sinus pressure and sinus pain. Negative for ear  pain, facial swelling, nosebleeds and sore throat.   Respiratory: Negative for cough and shortness of breath.        Objective:   Physical Exam Vitals signs reviewed.  Constitutional:      General: She is not in acute distress.    Appearance: Normal appearance. She is not ill-appearing.  HENT:     Right Ear: Tympanic membrane normal.     Left Ear: Tympanic membrane normal.     Mouth/Throat:     Mouth: Mucous membranes are moist.     Pharynx: Oropharynx is clear. No oropharyngeal exudate or posterior oropharyngeal erythema.  Neck:     Musculoskeletal: Neck supple.  Cardiovascular:     Rate and Rhythm: Normal rate and regular rhythm.  Pulmonary:     Effort: Pulmonary effort is normal.     Breath sounds: Normal breath sounds.  Lymphadenopathy:     Cervical: No cervical adenopathy.  Neurological:     Mental Status: She is alert.        Assessment:     -Patient presents with several week history of frontal and maxillary sinus pressure.  Questional sinusitis Does not typically have allergic symptoms this time of year.     Plan:     -Recommend trial of Augmentin 875 mg twice daily with food for 10 days -Stay well-hydrated -Consider imaging such as limited CT of sinuses if not improving over the next several days  [name] MD [facility]  at [facility]

## 2019-05-03 NOTE — Patient Instructions (Addendum)
Let me know if sinus symptoms not improving with the antibiotics.

## 2019-05-08 ENCOUNTER — Emergency Department (HOSPITAL_BASED_OUTPATIENT_CLINIC_OR_DEPARTMENT_OTHER): Payer: 59

## 2019-05-08 ENCOUNTER — Other Ambulatory Visit: Payer: Self-pay

## 2019-05-08 ENCOUNTER — Emergency Department (HOSPITAL_BASED_OUTPATIENT_CLINIC_OR_DEPARTMENT_OTHER)
Admission: EM | Admit: 2019-05-08 | Discharge: 2019-05-08 | Disposition: A | Discharge status: Home / Self Care | Payer: 59 | Attending: Emergency Medicine | Admitting: Emergency Medicine

## 2019-05-08 ENCOUNTER — Encounter (HOSPITAL_BASED_OUTPATIENT_CLINIC_OR_DEPARTMENT_OTHER): Payer: Self-pay | Admitting: *Deleted

## 2019-05-08 DIAGNOSIS — Z853 Personal history of malignant neoplasm of breast: Secondary | ICD-10-CM | POA: Diagnosis not present

## 2019-05-08 DIAGNOSIS — J3489 Other specified disorders of nose and nasal sinuses: Secondary | ICD-10-CM

## 2019-05-08 DIAGNOSIS — R42 Dizziness and giddiness: Secondary | ICD-10-CM | POA: Insufficient documentation

## 2019-05-08 DIAGNOSIS — M542 Cervicalgia: Secondary | ICD-10-CM | POA: Diagnosis not present

## 2019-05-08 DIAGNOSIS — R519 Headache, unspecified: Secondary | ICD-10-CM | POA: Insufficient documentation

## 2019-05-08 DIAGNOSIS — R5383 Other fatigue: Secondary | ICD-10-CM | POA: Diagnosis not present

## 2019-05-08 LAB — CBC WITH DIFFERENTIAL/PLATELET
Abs Immature Granulocytes: 0.01 10*3/uL (ref 0.00–0.07)
Basophils Absolute: 0 10*3/uL (ref 0.0–0.1)
Basophils Relative: 1 %
Eosinophils Absolute: 0 10*3/uL (ref 0.0–0.5)
Eosinophils Relative: 1 %
HCT: 43.6 % (ref 36.0–46.0)
Hemoglobin: 15.5 g/dL — ABNORMAL HIGH (ref 12.0–15.0)
Immature Granulocytes: 0 %
Lymphocytes Relative: 30 %
Lymphs Abs: 1.2 10*3/uL (ref 0.7–4.0)
MCH: 32.3 pg (ref 26.0–34.0)
MCHC: 35.6 g/dL (ref 30.0–36.0)
MCV: 90.8 fL (ref 80.0–100.0)
Monocytes Absolute: 0.3 10*3/uL (ref 0.1–1.0)
Monocytes Relative: 7 %
Neutro Abs: 2.5 10*3/uL (ref 1.7–7.7)
Neutrophils Relative %: 61 %
Platelets: 244 10*3/uL (ref 150–400)
RBC: 4.8 MIL/uL (ref 3.87–5.11)
RDW: 11.8 % (ref 11.5–15.5)
WBC: 4 10*3/uL (ref 4.0–10.5)
nRBC: 0 % (ref 0.0–0.2)

## 2019-05-08 LAB — BASIC METABOLIC PANEL
Anion gap: 9 (ref 5–15)
BUN: 15 mg/dL (ref 6–20)
CO2: 25 mmol/L (ref 22–32)
Calcium: 9.1 mg/dL (ref 8.9–10.3)
Chloride: 104 mmol/L (ref 98–111)
Creatinine, Ser: 0.89 mg/dL (ref 0.44–1.00)
GFR calc Af Amer: >60 mL/min (ref >60)
GFR calc non Af Amer: >60 mL/min (ref >60)
Glucose, Bld: 94 mg/dL (ref 70–99)
Potassium: 3.8 mmol/L (ref 3.5–5.1)
Sodium: 138 mmol/L (ref 135–145)

## 2019-05-08 MED ORDER — SODIUM CHLORIDE 0.9 % IV BOLUS: 1000.0000 mL | Freq: Once | INTRAVENOUS | Status: DC

## 2019-05-08 MED ORDER — IOHEXOL 350 MG/ML SOLN
100.0000 mL | Freq: Once | INTRAVENOUS | Status: AC | PRN
  Administered 2019-05-08: 100 mL via INTRAVENOUS

## 2019-05-08 NOTE — Discharge Instructions (Signed)
You were seen in the emergency department for possible sinus infection with sinus pressure left-sided neck pain fatigue and some tingling in your left arm.  You had blood work and a CAT scan that did not show any serious findings.  We are placing a referral for you to neurology.  It would be important for you to finish your antibiotics and also keep your primary care doctor aware of your symptoms.  Return to the emergency department if any worsening or concerning symptoms.

## 2019-05-08 NOTE — ED Notes (Signed)
ED Provider at bedside. 

## 2019-05-08 NOTE — ED Provider Notes (Signed)
[facility] EMERGENCY DEPARTMENT Provider Note   CSN: [id number] Arrival date & time: 05/08/19  1149     History   Chief Complaint Chief Complaint  Patient presents with   Headache    HPI Kathryn [name] is a 53 y.o. female.  He has a history of breast cancer.  She said she has been struggling with frontal sinus pressure for about 3 weeks.  It began with an aura, and has happened before usually with weather changes.  She is also noticed some tingling in her left arm and had some pain in the left side of her neck that is not there now.  She saw her primary care doctor and they put her on a course of Augmentin without any improvement in her symptoms.  She said her head feels foggy and she has been very fatigued.  She is concerned she might of had a stroke.  She said she has had these sinus infections and headaches in the past occurring with an aura that sometimes even cause her neuro symptoms like difficulty speaking.     The history is provided by the patient.  Headache Pain location:  Frontal Quality:  Dull Radiates to:  Face Pain severity now: moderate. Onset quality:  Gradual Duration:  3 weeks Timing:  Constant Progression:  Unchanged Chronicity:  Recurrent Similar to prior headaches: yes   Context comment:  Weather changes Relieved by:  Nothing Worsened by:  Nothing Ineffective treatments: abx. Associated symptoms: congestion, facial pain, fatigue, neck pain, sinus pressure and tingling   Associated symptoms: no abdominal pain, no blurred vision, no fever, no focal weakness, no neck stiffness, no photophobia, no seizures, no sore throat, no visual change, no vomiting and no weakness     Past Medical History:  Diagnosis Date   BREAST CANCER, HX OF 10/25/2008   Cancer ([place])    Family history of breast cancer    Family history of breast cancer    Family history of colon cancer    Family history of stomach cancer    HYPERLIPIDEMIA 10/25/2008     Patient Active Problem List   Diagnosis Date Noted   Neck pain of over 3 months duration 01/14/2016   Nonallopathic lesion of cervical region 01/14/2016   Nonallopathic lesion of thoracic region 01/14/2016   Nonallopathic lesion of rib cage 01/14/2016   Family history of stomach cancer    Family history of breast cancer    Family history of colon cancer    Atypical chest pain 04/20/2013   HYPERLIPIDEMIA 10/25/2008   BREAST CANCER, HX OF 10/25/2008    Past Surgical History:  Procedure Laterality Date   APPENDECTOMY  2003   CESAREAN SECTION  2004   MASTECTOMY Bilateral 2007 and 2009   TONSILLECTOMY  1974     OB History   No obstetric history on file.      Home Medications    Prior to Admission medications   Medication Sig Start Date End Date Taking? Authorizing Provider  amoxicillin-clavulanate (AUGMENTIN) 875-125 MG tablet Take 1 tablet by mouth 2 (two) times daily. 05/03/19   Burchette, Alinda Sierras, MD  BIOTIN PO Take by mouth daily.    [provider]  CALCIUM PO Take by mouth daily.    [provider]  diclofenac (VOLTAREN) 75 MG EC tablet Take one tablet po bid x 2 weeks and then prn Patient not taking: Reported on 05/03/2019 03/21/19   Aundra Dubin, PA-C  Ergocalciferol (VITAMIN D2) 2000 units  TABS Take 2,000 Units by mouth.    [provider]  Multiple Vitamin (MULTIVITAMIN) tablet Take 1 tablet by mouth daily.    [provider]  Omega-3 Fatty Acids (FISH OIL PO) Take by mouth every other day.    [provider]  Probiotic Product (PROBIOTIC-10) CAPS Take by mouth.    [provider]    Family History Family History  Problem Relation Age of Onset   Hyperlipidemia Mother    Hypertension Mother    Crohn's disease Mother    Cancer Mother        gastric cancer   Arthritis Other    Hyperlipidemia Other    Hypertension Other    Aneurysm Maternal Aunt 95       brain   Breast cancer  Paternal Aunt        dx in her late 91s   Colon cancer Paternal Uncle        dx and died in his 8s   Mental illness Paternal Uncle     Social History Social History   Tobacco Use   Smoking status: Never Smoker   Smokeless tobacco: Never Used  Substance Use Topics   Alcohol use: Yes    Alcohol/week: 0.0 standard drinks    Comment: 1-2 week   Drug use: No     Allergies   Patient has no known allergies.   Review of Systems Review of Systems  Constitutional: Positive for fatigue. Negative for fever.  HENT: Positive for congestion, sinus pressure and sinus pain. Negative for sore throat and voice change.   Eyes: Negative for blurred vision and photophobia.  Respiratory: Negative for shortness of breath.   Cardiovascular: Negative for chest pain.  Gastrointestinal: Negative for abdominal pain and vomiting.  Genitourinary: Negative for dysuria.  Musculoskeletal: Positive for neck pain. Negative for neck stiffness.  Skin: Negative for rash.  Neurological: Positive for headaches. Negative for focal weakness, seizures, facial asymmetry, speech difficulty and weakness.     Physical Exam Updated Vital Signs BP (!) 141/92    Pulse 65    Temp 97.9 F (36.6 C) (Oral)    Resp 18    Ht 5\' 10"  (1.778 m)    Wt 77.1 kg    SpO2 100%    BMI 24.39 kg/m   Physical Exam Vitals signs and nursing note reviewed.  Constitutional:      General: She is not in acute distress.    Appearance: She is well-developed.  HENT:     Head: Normocephalic and atraumatic.     Right Ear: Tympanic membrane normal.     Left Ear: Tympanic membrane normal.     Nose: Nose normal.     Mouth/Throat:     Mouth: Mucous membranes are moist.     Pharynx: Oropharynx is clear. No oropharyngeal exudate or posterior oropharyngeal erythema.  Eyes:     General: No visual field deficit.    Conjunctiva/sclera: Conjunctivae normal.  Neck:     Musculoskeletal: Neck supple. No neck rigidity.  Cardiovascular:      Rate and Rhythm: Normal rate and regular rhythm.     Heart sounds: No murmur.  Pulmonary:     Effort: Pulmonary effort is normal. No respiratory distress.     Breath sounds: Normal breath sounds.  Abdominal:     Palpations: Abdomen is soft.     Tenderness: There is no abdominal tenderness.  Musculoskeletal: Normal range of motion.        General: No  deformity or signs of injury.     Right lower leg: No edema.     Left lower leg: No edema.  Lymphadenopathy:     Cervical: No cervical adenopathy.  Skin:    General: Skin is warm and dry.     Capillary Refill: Capillary refill takes less than 2 seconds.  Neurological:     General: No focal deficit present.     Mental Status: She is alert.     GCS: GCS eye subscore is 4. GCS verbal subscore is 5. GCS motor subscore is 6.     Cranial Nerves: No cranial nerve deficit, dysarthria or facial asymmetry.     Sensory: No sensory deficit.     Motor: No weakness.     Gait: Gait normal.      ED Treatments / Results  Labs (all labs ordered are listed, but only abnormal results are displayed) Labs Reviewed  CBC WITH DIFFERENTIAL/PLATELET - Abnormal; Notable for the following components:      Result Value   Hemoglobin 15.5 (*)    All other components within normal limits  BASIC METABOLIC PANEL    EKG None  Radiology Ct Angio Head W Or Wo Contrast  Result Date: 05/08/2019 CLINICAL DATA:  Left-sided neck pain. Head pressure. Assess for carotid dissection. EXAM: CT ANGIOGRAPHY HEAD AND NECK TECHNIQUE: Multidetector CT imaging of the head and neck was performed using the standard protocol during bolus administration of intravenous contrast. Multiplanar CT image reconstructions and MIPs were obtained to evaluate the vascular anatomy. Carotid stenosis measurements (when applicable) are obtained utilizing NASCET criteria, using the distal internal carotid diameter as the denominator. CONTRAST:  179mL OMNIPAQUE IOHEXOL 350 MG/ML SOLN COMPARISON:   None. FINDINGS: CT HEAD FINDINGS Brain: The brain shows a normal appearance without evidence of malformation, atrophy, old or acute small or large vessel infarction, mass lesion, hemorrhage, hydrocephalus or extra-axial collection. Vascular: No hyperdense vessel. No evidence of atherosclerotic calcification. Skull: Normal.  No traumatic finding.  No focal bone lesion. Sinuses/Orbits: Sinuses are clear. Orbits appear normal. Mastoids are clear. Other: None significant CTA NECK FINDINGS Aortic arch: Minimal atherosclerotic calcification. No aneurysm, dissection or luminal irregularity. Right carotid system: Common carotid artery is widely patent to the bifurcation. Carotid bifurcation is normal without soft or calcified plaque. Cervical ICA is normal. Left carotid system: Common carotid artery widely patent to the bifurcation. Carotid bifurcation is normal. Cervical ICA is normal. Vertebral arteries: Both vertebral arteries are widely patent at their origins and through the cervical region to the foramen magnum. Skeleton: Normal Other neck: Normal.  No mass or lymphadenopathy. Upper chest: Normal Review of the MIP images confirms the above findings CTA HEAD FINDINGS Anterior circulation: Both internal carotid arteries are patent through the skull base and siphon regions. No stenosis. The anterior and middle cerebral vessels are normal without stenosis, aneurysm or vascular malformation. Posterior circulation: Both vertebral arteries are patent to the basilar. No basilar stenosis. Posterior circulation branch vessels are normal. Venous sinuses: Patent and normal. Anatomic variants: None significant. Review of the MIP images confirms the above findings IMPRESSION: Head CT: Normal. CT angiography of the neck and head: Normal. No vascular dissection. No stenotic disease. No abnormality seen to explain the left-sided symptoms. Electronically Signed   By: Nelson Chimes M.D.   On: 05/08/2019 13:43   Ct Angio Neck W And/or  Wo Contrast  Result Date: 05/08/2019 CLINICAL DATA:  Left-sided neck pain. Head pressure. Assess for carotid dissection. EXAM: CT ANGIOGRAPHY HEAD AND  NECK TECHNIQUE: Multidetector CT imaging of the head and neck was performed using the standard protocol during bolus administration of intravenous contrast. Multiplanar CT image reconstructions and MIPs were obtained to evaluate the vascular anatomy. Carotid stenosis measurements (when applicable) are obtained utilizing NASCET criteria, using the distal internal carotid diameter as the denominator. CONTRAST:  146mL OMNIPAQUE IOHEXOL 350 MG/ML SOLN COMPARISON:  None. FINDINGS: CT HEAD FINDINGS Brain: The brain shows a normal appearance without evidence of malformation, atrophy, old or acute small or large vessel infarction, mass lesion, hemorrhage, hydrocephalus or extra-axial collection. Vascular: No hyperdense vessel. No evidence of atherosclerotic calcification. Skull: Normal.  No traumatic finding.  No focal bone lesion. Sinuses/Orbits: Sinuses are clear. Orbits appear normal. Mastoids are clear. Other: None significant CTA NECK FINDINGS Aortic arch: Minimal atherosclerotic calcification. No aneurysm, dissection or luminal irregularity. Right carotid system: Common carotid artery is widely patent to the bifurcation. Carotid bifurcation is normal without soft or calcified plaque. Cervical ICA is normal. Left carotid system: Common carotid artery widely patent to the bifurcation. Carotid bifurcation is normal. Cervical ICA is normal. Vertebral arteries: Both vertebral arteries are widely patent at their origins and through the cervical region to the foramen magnum. Skeleton: Normal Other neck: Normal.  No mass or lymphadenopathy. Upper chest: Normal Review of the MIP images confirms the above findings CTA HEAD FINDINGS Anterior circulation: Both internal carotid arteries are patent through the skull base and siphon regions. No stenosis. The anterior and middle  cerebral vessels are normal without stenosis, aneurysm or vascular malformation. Posterior circulation: Both vertebral arteries are patent to the basilar. No basilar stenosis. Posterior circulation branch vessels are normal. Venous sinuses: Patent and normal. Anatomic variants: None significant. Review of the MIP images confirms the above findings IMPRESSION: Head CT: Normal. CT angiography of the neck and head: Normal. No vascular dissection. No stenotic disease. No abnormality seen to explain the left-sided symptoms. Electronically Signed   By: Nelson Chimes M.D.   On: 05/08/2019 13:43    Procedures Procedures (including critical care time)  Medications Ordered in ED Medications  iohexol (OMNIPAQUE) 350 MG/ML injection 100 mL (100 mLs Intravenous Contrast Given 05/08/19 1311)     Initial Impression / Assessment and Plan / ED Course  I have reviewed the triage vital signs and the nursing notes.  Pertinent labs & imaging results that were available during my care of the patient were reviewed by me and considered in my medical decision making (see chart for details).  Clinical Course as of May 07 1804  Mon May 07, 3774  5077 53 year old female with sinus type symptoms for 3 weeks.  She says it starts with an aura and is caused her neurologic problems in the past.  This sounds more migrainous although denies history of migraines.  Treated with antibiotics ready for possible sinus disease.  Differential includes sinusitis, migraine, stroke, bleed, dissection.   [MB]  [id number] Patient's work-up has been unremarkable including her CNS imaging.  I reviewed all this with her.  I offered her migraine cocktail which she declines.  We will give her neurology referral.  She understands to follow-up with her primary care doctor return if any worsening symptoms.   [MB]    Clinical Course User Index [MB] Hayden Rasmussen, MD        Final Clinical Impressions(s) / ED Diagnoses   Final diagnoses:    Sinus pressure  Neck pain on left side  Fatigue, unspecified type    ED Discharge Orders  Ordered    Ambulatory referral to Neurology    Comments: An appointment is requested in approximately: 2 weeks   05/08/19 1407           Hayden Rasmussen, MD 05/08/19 1806

## 2019-05-08 NOTE — ED Triage Notes (Signed)
Pressure in her head x 3 weeks. She is being treated for a sinus infection. Tightness in the left side of her neck.

## 2019-05-09 ENCOUNTER — Encounter: Payer: Self-pay | Admitting: Neurology

## 2019-05-09 ENCOUNTER — Ambulatory Visit: Payer: 59 | Admitting: Neurology

## 2019-05-09 VITALS — BP 116/78 | HR 70 | Temp 97.9°F | Ht 70.0 in | Wt 176.0 lb

## 2019-05-09 DIAGNOSIS — R430 Anosmia: Secondary | ICD-10-CM

## 2019-05-09 DIAGNOSIS — R432 Parageusia: Secondary | ICD-10-CM

## 2019-05-09 DIAGNOSIS — R519 Headache, unspecified: Secondary | ICD-10-CM

## 2019-05-09 NOTE — Progress Notes (Signed)
Provider:  Larey Seat, MD  Primary Care Physician:  [name], MD [place] [address]     Referring Provider: [name], Md [address] [place],  [place] [address]          Chief Complaint according to patient   Patient presents with:    . New Patient (Initial Visit)           HISTORY OF PRESENT ILLNESS:  Kathryn [name] is a 53 y.o. year old White or Caucasian female patient seen on 05/09/2019  for a headache evaluation.   Chief concern according to patient :  I am a breast cancer survivor and have sometimes in autumn a headache that hurts behind the eye , on the left cheekbone, and twitching in the inner eyelid. It goes away , but this year it has lasted 3 weeks already. CT was negative.    I have the pleasure of seeing Kathryn [name] today, a right -handed White or Caucasian female with a headache onset 3 weeks ago.  She has a past medical history of BREAST CANCER, HX OF (10/25/2008), Cancer [facility]), Family history of breast cancer, Family history of breast cancer, Family history of colon cancer, Family history of stomach cancer, and HYPERLIPIDEMIA (10/25/2008).  She denies Vertigo, Diplopia  but has tunnel vision, visual spatial contortion.     Relevant medical history: neck pain, neck tension left side. Tonsillectomy yes,  No deviated septum .  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  And lives in a household with 3 persons. Family status is married , with a 11 year old daughter. The family has a dog./ The patient currently works shifts. Tobacco use never .  ETOH use - 2-3 glasses a week,  Caffeine intake in form of Coffee( in AM ) Soda( once a week) Tea ( only rarely ). No  energy drinks. Regular exercise: yes.   Sleep habits are as follows: The patient's dinner time is between 6 PM. The patient goes to bed at 9.30 PM and continues to sleep for several hours,  wakes for 1 bathroom break. The preferred sleep position is on the left side , with the support of 2 pillows. Dreams are reportedly frequent/vivid.   6 AM is the usual rise time. The patient wakes up spontaneously. She reports not longer feeling refreshed or restored in AM, for the last 2-3 weeks. Naps whenever she has an opportunity- 30 minutes. No headaches    Review of Systems: Out of a complete 14 system review, the patient complains of only the following symptoms, and all other reviewed systems are negative.:  Fatigue, headache      Social History   Socioeconomic History  . Marital status: Married    Spouse name: Not on file  . Number of children: Not on file  . Years of education: Not on file  . Highest education level: Not on file  Occupational History  . Occupation: Financial risk analyst: [employer]  Social Needs  . Financial resource strain: Not on file  . Food insecurity    Worry: Not on file    Inability: Not on file  . Transportation needs    Medical: Not on file    Non-medical: Not on file  Tobacco Use  . Smoking status: Never Smoker  . Smokeless tobacco: Never Used  Substance and Sexual  Activity  . Alcohol use: Yes    Alcohol/week: 0.0 standard drinks    Comment: 1-2 week  . Drug use: No  . Sexual activity: Not on file  Lifestyle  . Physical activity    Days per week: Not on file    Minutes per session: Not on file  . Stress: Not on file  Relationships  . Social Herbalist on phone: Not on file    Gets together: Not on file    Attends religious service: Not on file    Active member of club or organization: Not on file    Attends meetings of clubs or organizations: Not on file    Relationship status: Not on file  Other Topics Concern  . Not on file  Social History Narrative  . Not on file    Family History  Problem Relation Age of Onset  . Hyperlipidemia Mother   . Hypertension Mother   . Crohn's disease Mother    . Cancer Mother        gastric cancer  . Arthritis Other   . Hyperlipidemia Other   . Hypertension Other   . Aneurysm Maternal Aunt 40       brain  . Breast cancer Paternal Aunt        dx in her late 25s  . Colon cancer Paternal Uncle        dx and died in his 6s  . Mental illness Paternal Uncle     Past Medical History:  Diagnosis Date  . BREAST CANCER, HX OF 10/25/2008  . Cancer ([place])   . Family history of breast cancer   . Family history of breast cancer   . Family history of colon cancer   . Family history of stomach cancer   . HYPERLIPIDEMIA 10/25/2008    Past Surgical History:  Procedure Laterality Date  . APPENDECTOMY  2003  . CESAREAN SECTION  2004  . MASTECTOMY Bilateral 2007 and 2009  . TONSILLECTOMY  1974     Current Outpatient Medications on File Prior to Visit  Medication Sig Dispense Refill  . amoxicillin-clavulanate (AUGMENTIN) 875-125 MG tablet Take 1 tablet by mouth 2 (two) times daily. 20 tablet 0  . BIOTIN PO Take by mouth daily.    Marland Kitchen CALCIUM PO Take by mouth daily.    . Ergocalciferol (VITAMIN D2) 2000 units TABS Take 2,000 Units by mouth.    . Multiple Vitamin (MULTIVITAMIN) tablet Take 1 tablet by mouth daily.    . Omega-3 Fatty Acids (FISH OIL PO) Take by mouth every other day.    . Probiotic Product (PROBIOTIC-10) CAPS Take by mouth.     No current facility-administered medications on file prior to visit.     No Known Allergies  Physical exam:  Today's Vitals   05/09/19 1502  BP: 116/78  Pulse: 70  Temp: 97.9 F (36.6 C)  Weight: 176 lb (79.8 kg)  Height: 5\' 10"  (1.778 m)   Body mass index is 25.25 kg/m.   Wt Readings from Last 3 Encounters:  05/09/19 176 lb (79.8 kg)  05/08/19 170 lb (77.1 kg)  05/03/19 176 lb 4.8 oz (80 kg)     Ht Readings from Last 3 Encounters:  05/09/19 5\' 10"  (1.778 m)  05/08/19 5\' 10"  (1.778 m)  05/03/19 6' 0.25" (1.835 m)      General: The patient is awake, alert and appears not in acute distress.  The patient is well groomed. Head: Normocephalic, atraumatic.  Neck is supple.Cardiovascular:  Regular rate and cardiac rhythm by pulse,  without distended neck veins. Respiratory: Lungs are clear to auscultation.  Skin:  Without evidence of ankle edema, or rash. Trunk: The patient's posture is erect.   Neurologic exam : The patient is awake and alert, oriented to place and time.   Memory subjective described as intact.  Attention span & concentration ability appears normal.  Speech is fluent,  without  dysarthria, dysphonia or aphasia.  Mood and affect are appropriate.   Cranial nerves: no loss of smell or taste reported  Pupils are equal and briskly reactive to light. Funduscopic exam deferred.   Extraocular movements in vertical and horizontal planes were intact and without nystagmus. No Diplopia. Visual fields by finger perimetry are intact. Hearing was intact to soft voice and finger rubbing.    Facial sensation - throbbing retroauricular ,and left cheekbone.    Facial motor strength is symmetric and tongue and uvula move midline.  Neck ROM : rotation, tilt and flexion extension were normal for age and shoulder shrug was symmetrical.    Motor exam:  Symmetric bulk, tone and ROM.   Normal tone without cog- wheeling, symmetric grip strength . Sensory:  Fine touch, pinprick and vibration were tested  and  normal.  Proprioception tested in the upper extremities was normal. Coordination: Rapid alternating movements in the fingers/hands were of normal speed.  The Finger-to-nose maneuver was intact without evidence of ataxia, dysmetria or tremor.  Gait and station: Patient could rise unassisted from a seated position, walked without assistive device.  Stance is of normal width. Toe and heel walk were deferred.  Deep tendon reflexes: in the  upper and lower extremities are symmetric and intact.  Babinski response was deferred .     After spending a total time of 35 minutes face to face  and additional time for physical and neurologic examination, review of laboratory studies,  personal review of imaging studies, reports and results of other testing and review of referral information / records as far as provided in visit, I have established the following assessments:  1) I have no explanation for the type and new onset of head pain- the sinus component is likeley and may have resolved before the CT was obtained, she now reports loss of taste and smell.  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     My Plan is to proceed with:  I would like to thank [name], MD  [place],  [place] [address] for allowing me to meet with and to take care of this pleasant patient.   In short, Kathryn [name] is presenting with anosmia, loss of taste but no cough, no fever, no SOB- just ha hemi-headache. Felt miserable and tired.     I will order covid 19 antibodies.   I plan to follow up as needed. CC: I will share my notes with PCP   Electronically signed by: Larey Seat, MD 05/09/2019 3:04 PM  [facility] Board certified by The [employer] of Sleep Medicine and Diplomate of the [employer] of Sleep Medicine. Board certified In Neurology through the [place], Fellow of the [employer] of Neurology. Medical Director of [facility].

## 2019-05-10 ENCOUNTER — Encounter: Payer: Self-pay | Admitting: Neurology

## 2019-05-10 LAB — EUROIMMUN SARS-COV-2 AB, IGG: Euroimmun SARS-CoV-2 Ab, IgG: POSITIVE — AB

## 2019-05-10 LAB — SAR COV2 SEROLOGY (COVID19)AB(IGG),IA

## 2019-05-22 ENCOUNTER — Encounter: Payer: Self-pay | Admitting: *Deleted

## 2019-05-22 ENCOUNTER — Telehealth: Payer: Self-pay | Admitting: Neurology

## 2019-05-22 NOTE — Telephone Encounter (Signed)
Spoke with Dr. Jaynee Eagles who will defer pt's question to Dr. Brett Fairy upon her return to the office tomorrow.

## 2019-05-22 NOTE — Telephone Encounter (Signed)
Pt is asking for a call from RN to discuss her  1st negative Covid test and her most recent positive Covid test.  Please call

## 2019-05-23 NOTE — Telephone Encounter (Signed)
It looks like two antibody tests were done, one negative, one positive. I do not see a pcr covid test. I messaged the pt in mychart and let her know I would talk to you and she may want to discuss with primary care.

## 2019-05-23 NOTE — Telephone Encounter (Signed)
If there were more than 14 days between 2 antibody tests it is possible to find IG A- but not IGG in her system, if IGG was found , it should have been more than 3-4 weeks after an infection.

## 2019-05-23 NOTE — Telephone Encounter (Signed)
I ordered covid virus test as an antibody test due to reported anosmia- that one returned negative. When was she tested for COVID by PCR?

## 2019-05-23 NOTE — Telephone Encounter (Signed)
I spoke with Dr. Brett Fairy. She called the patient and discussed that yes, the patient had recently had the COVID virus per the positive antibodies on the test. The patient was very appreciative for the call. She also mentioned the she was feeling better.

## 2019-05-25 ENCOUNTER — Telehealth: Payer: Self-pay | Admitting: Family Medicine

## 2019-05-25 NOTE — Telephone Encounter (Signed)
Patient would like to speak with Dr. Elease Hashimoto CMA about a ING test that she had and it came back positive. She had this done at [facility]. Patient wants to know what is the next step for her. Please call patient back, thanks.

## 2019-05-25 NOTE — Telephone Encounter (Signed)
See note

## 2019-05-26 NOTE — Telephone Encounter (Signed)
Please see message.  Please advise. 

## 2019-05-26 NOTE — Telephone Encounter (Signed)
Spoke with patient she declined the doxy visit. She stated that she understands that the test does not tell us when she has had covid. She will continue to monitor for symptoms and call back with any questions or concerns.

## 2019-05-26 NOTE — Telephone Encounter (Signed)
Can offer Doxy if she would like.  Positive IgG test means that she has had Covid at some point in the past but does not tell us exactly when she acquired the infection.

## 2019-10-22 ENCOUNTER — Encounter: Payer: Self-pay | Admitting: Family Medicine

## 2019-12-05 LAB — COMPREHENSIVE METABOLIC PANEL
Albumin: 4.8 (ref 3.5–5.0)
Calcium: 9.7 (ref 8.7–10.7)
GFR calc Af Amer: 92
GFR calc non Af Amer: 80

## 2019-12-05 LAB — CBC AND DIFFERENTIAL
HCT: 41 (ref 36–46)
Hemoglobin: 14.3 (ref 12.0–16.0)
Platelets: 244 (ref 150–399)
WBC: 4.4

## 2019-12-05 LAB — HEPATIC FUNCTION PANEL
ALT: 17 (ref 7–35)
AST: 25 (ref 13–35)
Bilirubin, Direct: 0.09 (ref 0.01–0.4)
Bilirubin, Total: 0.3

## 2019-12-05 LAB — BASIC METABOLIC PANEL
BUN: 19 (ref 4–21)
CO2: 22 (ref 13–22)
Chloride: 101 (ref 99–108)
Creatinine: 0.8 (ref 0.5–1.1)
Glucose: 81
Potassium: 4.3 (ref 3.4–5.3)
Sodium: 139 (ref 137–147)

## 2019-12-05 LAB — CBC: RBC: 4.6 (ref 3.87–5.11)

## 2019-12-08 ENCOUNTER — Encounter: Payer: Self-pay | Admitting: Family Medicine

## 2019-12-18 ENCOUNTER — Other Ambulatory Visit: Payer: Self-pay

## 2019-12-18 ENCOUNTER — Ambulatory Visit (INDEPENDENT_AMBULATORY_CARE_PROVIDER_SITE_OTHER): Payer: 59

## 2019-12-18 ENCOUNTER — Ambulatory Visit: Payer: 59 | Admitting: Family Medicine

## 2019-12-18 ENCOUNTER — Ambulatory Visit: Payer: Self-pay

## 2019-12-18 ENCOUNTER — Encounter: Payer: Self-pay | Admitting: Family Medicine

## 2019-12-18 VITALS — BP 114/80 | HR 54 | Ht 70.0 in | Wt 166.0 lb

## 2019-12-18 DIAGNOSIS — M25522 Pain in left elbow: Secondary | ICD-10-CM

## 2019-12-18 DIAGNOSIS — M79602 Pain in left arm: Secondary | ICD-10-CM | POA: Diagnosis not present

## 2019-12-18 MED ORDER — GABAPENTIN 100 MG PO CAPS: 200.0000 mg | ORAL_CAPSULE | Freq: Every day | ORAL | 0 refills | Status: DC

## 2019-12-18 MED ORDER — MELOXICAM 15 MG PO TABS: 15.0000 mg | ORAL_TABLET | Freq: Every day | ORAL | 0 refills | Status: DC

## 2019-12-18 NOTE — Assessment & Plan Note (Signed)
Left arm pain.  Patient has had this for some time now it appears.  Likely more of a radicular symptoms we have believe.  We will get x-rays ordered.  Discussed a low dose of the gabapentin as well as the meloxicam.  Discussed over-the-counter medications that I think will be beneficial as well.  Discussed which activities to doing which wants to avoid.  Increase activity as tolerated.

## 2019-12-18 NOTE — Progress Notes (Signed)
[place] [address] [place] Phone: [phone] Subjective:   I Kathryn [name] am serving as a Education administrator for Dr. Hulan Saas.  This visit occurred during the SARS-CoV-2 public health emergency.  Safety protocols were in place, including screening questions prior to the visit, additional usage of staff PPE, and extensive cleaning of exam room while observing appropriate contact time as indicated for disinfecting solutions.   I'm seeing this patient by the request  of:  [name], MD  CC: Left arm pain  [id number]  Kathryn [name] is a 54 y.o. female coming in with complaint of wrist/elbow pain. Patient states after covid she started lifting weights a bit. Patient is left hand dominant. History of [place] on the left wrist. Patient states she has numbness and cold fingers at time. Patient sates she is very active. Patient believes it may be arthritis. Pain was worse in February. Remembers hitting a banister directly on the elbow.    Onset- left elbow/ wrist  Location - medial Character- elbow- sore  Aggravating factors- rows, rope pull downs, elbow flexion with sitting at desk  Therapies tried- ice, heat, topical, exercise, tens unit Severity- 4-6/10 at its worse      Past Medical History:  Diagnosis Date  . BREAST CANCER, HX OF 10/25/2008  . Cancer ([place])   . Family history of breast cancer   . Family history of breast cancer   . Family history of colon cancer   . Family history of stomach cancer   . HYPERLIPIDEMIA 10/25/2008   Past Surgical History:  Procedure Laterality Date  . APPENDECTOMY  2003  . CESAREAN SECTION  2004  . MASTECTOMY Bilateral 2007 and 2009  . TONSILLECTOMY  1974   Social History   Socioeconomic History  . Marital status: Married    Spouse name: Not on file  . Number of children: Not on file  . Years of education: Not on file  . Highest education level: Not on file  Occupational History  .  Occupation: Financial risk analyst: [employer]  Tobacco Use  . Smoking status: Never Smoker  . Smokeless tobacco: Never Used  Vaping Use  . Vaping Use: Never used  Substance and Sexual Activity  . Alcohol use: Yes    Alcohol/week: 0.0 standard drinks    Comment: 1-2 week  . Drug use: No  . Sexual activity: Not on file  Other Topics Concern  . Not on file  Social History Narrative  . Not on file   Social Determinants of Health   Financial Resource Strain:   . Difficulty of Paying Living Expenses:   Food Insecurity:   . Worried About Charity fundraiser in the Last Year:   . Arboriculturist in the Last Year:   Transportation Needs:   . Film/video editor (Medical):   Marland Kitchen Lack of Transportation (Non-Medical):   Physical Activity:   . Days of Exercise per Week:   . Minutes of Exercise per Session:   Stress:   . Feeling of Stress :   Social Connections:   . Frequency of Communication with Friends and Family:   . Frequency of Social Gatherings with Friends and Family:   . Attends Religious Services:   . Active Member of Clubs or Organizations:   . Attends Archivist Meetings:   Marland Kitchen Marital Status:    No Known Allergies Family History  Problem Relation Age  of Onset  . Hyperlipidemia Mother   . Hypertension Mother   . Crohn's disease Mother   . Cancer Mother        gastric cancer  . Arthritis Other   . Hyperlipidemia Other   . Hypertension Other   . Aneurysm Maternal Aunt 40       brain  . Breast cancer Paternal Aunt        dx in her late 8s  . Colon cancer Paternal Uncle        dx and died in his 50s  . Mental illness Paternal Uncle        Current Outpatient Medications (Analgesics):  .  meloxicam (MOBIC) 15 MG tablet, Take 1 tablet (15 mg total) by mouth daily.   Current Outpatient Medications (Other):  .  amoxicillin-clavulanate (AUGMENTIN) 875-125 MG tablet, Take 1 tablet by mouth 2 (two) times daily. Marland Kitchen  BIOTIN PO, Take by  mouth daily. Marland Kitchen  CALCIUM PO, Take by mouth daily. .  Ergocalciferol (VITAMIN D2) 2000 units TABS, Take 2,000 Units by mouth. .  Multiple Vitamin (MULTIVITAMIN) tablet, Take 1 tablet by mouth daily. .  Omega-3 Fatty Acids (FISH OIL PO), Take by mouth every other day. .  Probiotic Product (PROBIOTIC-10) CAPS, Take by mouth. .  gabapentin (NEURONTIN) 100 MG capsule, Take 2 capsules (200 mg total) by mouth at bedtime.   Reviewed prior external information including notes and imaging from  primary care provider As well as notes that were available from care everywhere and other healthcare systems.  Past medical history, social, surgical and family history all reviewed in electronic medical record.  No pertanent information unless stated regarding to the chief complaint.   Review of Systems:  No headache, visual changes, nausea, vomiting, diarrhea, constipation, dizziness, abdominal pain, skin rash, fevers, chills, night sweats, weight loss, swollen lymph nodes, body aches, joint swelling, chest pain, shortness of breath, mood changes. POSITIVE muscle aches  Objective  Blood pressure 114/80, pulse (!) 54, height 5\' 10"  (1.778 m), weight 166 lb (75.3 kg), SpO2 97 %.   General: No apparent distress alert and oriented x3 mood and affect normal, dressed appropriately.  HEENT: Pupils equal, extraocular movements intact  Respiratory: Patient's speak in full sentences and does not appear short of breath  Cardiovascular: No lower extremity edema, non tender, no erythema  Neuro: Cranial nerves II through XII are intact, neurovascularly intact in all extremities with 2+ DTRs and 2+ pulses.  Gait normal with good balance and coordination.  MSK:  Left elbow does have some tenderness over the medial aspect of the elbow but does have full strength at the moment.  Positive Tinel's over the ulnar nerve.  Patient does have very mild positive Tinel's over the median nerve at the wrist as well.  Full strength and  movement at the wrist noted though today.  Good grip strength.  Limited musculoskeletal ultrasound was performed and interpreted by Lyndal Pulley  Limited ultrasound shows that patient median nerve is within the normal range.  Patient's ulnar nerve is sitting within the groove.  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   Impression: TFCC tear otherwise unremarkable      Impression and Recommendations:     The above documentation has been reviewed and is accurate and complete Lyndal Pulley, DO       Note: This dictation was prepared with Dragon dictation along with smaller phrase  technology. Any transcriptional errors that result from this process are unintentional.

## 2019-12-18 NOTE — Patient Instructions (Addendum)
   Gabapentin 100mg  MEloxcicam 10 days then as needed CoQ10 200mg  daily until bottle gone Xray today  See me in 5-6 weeks

## 2020-01-09 ENCOUNTER — Other Ambulatory Visit: Payer: Self-pay | Admitting: Family Medicine

## 2020-01-22 ENCOUNTER — Encounter: Payer: Self-pay | Admitting: Family Medicine

## 2020-01-22 ENCOUNTER — Other Ambulatory Visit: Payer: Self-pay

## 2020-01-22 ENCOUNTER — Ambulatory Visit: Payer: Self-pay

## 2020-01-22 ENCOUNTER — Ambulatory Visit: Payer: 59 | Admitting: Family Medicine

## 2020-01-22 VITALS — BP 110/80 | HR 76 | Ht 70.0 in | Wt 164.0 lb

## 2020-01-22 DIAGNOSIS — G5602 Carpal tunnel syndrome, left upper limb: Secondary | ICD-10-CM | POA: Diagnosis not present

## 2020-01-22 DIAGNOSIS — M25532 Pain in left wrist: Secondary | ICD-10-CM

## 2020-01-22 DIAGNOSIS — G5600 Carpal tunnel syndrome, unspecified upper limb: Secondary | ICD-10-CM | POA: Insufficient documentation

## 2020-01-22 NOTE — Assessment & Plan Note (Signed)
Patient given a carpal tunnel injection to see if it will be beneficial.  May need to consider the possibility of osteopathic manipulation as well.  Patient is going to do more bracing on a regular basis as well as the gabapentin.  Follow-up with me again in 4 to 8 weeks

## 2020-01-22 NOTE — Patient Instructions (Addendum)
Good to see you Wear brace at night for next 2 weeks.  New exercises for carpal tunnel and lets see how it goes.  Stay active See me again in 6 weeks

## 2020-01-22 NOTE — Progress Notes (Signed)
[place] [address] [place] Phone: [phone] Subjective:   I [name] am serving as a Education administrator for Dr. Hulan Saas.  This visit occurred during the SARS-CoV-2 public health emergency.  Safety protocols were in place, including screening questions prior to the visit, additional usage of staff PPE, and extensive cleaning of exam room while observing appropriate contact time as indicated for disinfecting solutions.   I'm seeing this patient by the request  of:  Kathryn Post, MD  CC: left elbow and wrist pain.   [id number]   12/18/2019 Left arm pain.  Patient has had this for some time now it appears.  Likely more of a radicular symptoms we have believe.  We will get x-rays ordered.  Discussed a low dose of the gabapentin as well as the meloxicam.  Discussed over-the-counter medications that I think will be beneficial as well.  Discussed which activities to doing which wants to avoid.  Increase activity as tolerated.  Update 01/22/2020 Kathryn [name] is a 54 y.o. female coming in with complaint of left elbow and bilateral wrist pain. Elbow doing better wrist is still painful.  Patient has noticed more the pain in the wrist recently.  Patient states that there is some numbness and tingling associated with it.  Seems to be worse at night in the early mornings.  States that it does not affect daily activities at this time     Past Medical History:  Diagnosis Date  . BREAST CANCER, HX OF 10/25/2008  . Cancer ([place])   . Family history of breast cancer   . Family history of breast cancer   . Family history of colon cancer   . Family history of stomach cancer   . HYPERLIPIDEMIA 10/25/2008   Past Surgical History:  Procedure Laterality Date  . APPENDECTOMY  2003  . CESAREAN SECTION  2004  . MASTECTOMY Bilateral 2007 and 2009  . TONSILLECTOMY  1974   Social History   Socioeconomic History  . Marital status: Married     Spouse name: Not on file  . Number of children: Not on file  . Years of education: Not on file  . Highest education level: Not on file  Occupational History  . Occupation: Financial risk analyst: [name]  Tobacco Use  . Smoking status: Never Smoker  . Smokeless tobacco: Never Used  Vaping Use  . Vaping Use: Never used  Substance and Sexual Activity  . Alcohol use: Yes    Alcohol/week: 0.0 standard drinks    Comment: 1-2 week  . Drug use: No  . Sexual activity: Not on file  Other Topics Concern  . Not on file  Social History Narrative  . Not on file   Social Determinants of Health   Financial Resource Strain:   . Difficulty of Paying Living Expenses:   Food Insecurity:   . Worried About Charity fundraiser in the Last Year:   . Arboriculturist in the Last Year:   Transportation Needs:   . Film/video editor (Medical):   Kathryn [name] Lack of Transportation (Non-Medical):   Physical Activity:   . Days of Exercise per Week:   . Minutes of Exercise per Session:   Stress:   . Feeling of Stress :   Social Connections:   . Frequency of Communication with Friends and Family:   . Frequency of Social Gatherings with Friends and Family:   .  Attends Religious Services:   . Active Member of Clubs or Organizations:   . Attends Archivist Meetings:   Kathryn [name] Marital Status:    No Known Allergies Family History  Problem Relation Age of Onset  . Hyperlipidemia Mother   . Hypertension Mother   . Crohn's disease Mother   . Cancer Mother        gastric cancer  . Arthritis Other   . Hyperlipidemia Other   . Hypertension Other   . Aneurysm Maternal Aunt 40       brain  . Breast cancer Paternal Aunt        dx in her late 50s  . Colon cancer Paternal Uncle        dx and died in his 63s  . Mental illness Paternal Uncle        Current Outpatient Medications (Analgesics):  .  meloxicam (MOBIC) 15 MG tablet, TAKE 1 TABLET BY MOUTH EVERY DAY   Current  Outpatient Medications (Other):  .  amoxicillin-clavulanate (AUGMENTIN) 875-125 MG tablet, Take 1 tablet by mouth 2 (two) times daily. Kathryn [name]  BIOTIN PO, Take by mouth daily. Kathryn [name]  CALCIUM PO, Take by mouth daily. .  Ergocalciferol (VITAMIN D2) 2000 units TABS, Take 2,000 Units by mouth. .  gabapentin (NEURONTIN) 100 MG capsule, Take 2 capsules (200 mg total) by mouth at bedtime. .  Multiple Vitamin (MULTIVITAMIN) tablet, Take 1 tablet by mouth daily. .  Omega-3 Fatty Acids (FISH OIL PO), Take by mouth every other day. .  Probiotic Product (PROBIOTIC-10) CAPS, Take by mouth.   Reviewed prior external information including notes and imaging from  primary care provider As well as notes that were available from care everywhere and other healthcare systems.  Past medical history, social, surgical and family history all reviewed in electronic medical record.  No pertanent information unless stated regarding to the chief complaint.   Review of Systems:  No headache, visual changes, nausea, vomiting, diarrhea, constipation, dizziness, abdominal pain, skin rash, fevers, chills, night sweats, weight loss, swollen lymph nodes, body aches, joint swelling, chest pain, shortness of breath, mood changes. POSITIVE muscle aches  Objective  Blood pressure 110/80, pulse 76, height 5\' 10"  (1.778 m), weight 164 lb (74.4 kg), SpO2 98 %.   General: No apparent distress alert and oriented x3 mood and affect normal, dressed appropriately.  HEENT: Pupils equal, extraocular movements intact  Respiratory: Patient's speak in full sentences and does not appear short of breath  Cardiovascular: No lower extremity edema, non tender, no erythema  Neuro: Cranial nerves II through XII are intact, neurovascularly intact in all extremities with 2+ DTRs and 2+ pulses.  Gait normal with good balance and coordination.  MSK: Left wrist exam shows some very minimal tenderness over the TFCC but improvement in range of motion.  More pain  actually over the median nerve with Tinel's.  Good grip strength noted.  Elbow is significantly improved.  Limited musculoskeletal ultrasound was performed and interpreted by Lyndal Pulley  Limited musculoskeletal ultrasound of patient's wrist shows that there is some degenerative changes in TFCC but very comparable to the contralateral side.  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  Pain with compression in the area.  Otherwise fairly unremarkable.  Procedure: Real-time Ultrasound Guided Injection of left carpal tunnel Device: GE Logiq Q7  Ultrasound guided injection is preferred based studies that show increased duration, increased effect, greater accuracy, decreased procedural pain, increased response rate  with ultrasound guided versus blind injection.  Verbal informed consent obtained.  Time-out conducted.  Noted no overlying erythema, induration, or other signs of local infection.  Skin prepped in a sterile fashion.  Local anesthesia: Topical Ethyl chloride.  With sterile technique and under real time ultrasound guidance:  median nerve visualized.  23g 5/8 inch needle inserted distal to proximal approach into nerve sheath. Pictures taken nfor needle placement. Patient did have injection of 2 cc of 1% lidocaine, 1 cc of 0.5% Marcaine, and 1 cc of Kenalog 40 mg/dL. Completed without difficulty  Pain immediately resolved suggesting accurate placement of the medication.  Advised to call if fevers/chills, erythema, induration, drainage, or persistent bleeding.  Images permanently stored and available for review in the ultrasound unit.  Impression: Technically successful ultrasound guided injection. Impression and Recommendations:     The above documentation has been reviewed and is accurate and complete Lyndal Pulley, DO       Note: This dictation was prepared with Dragon dictation along with smaller phrase technology. Any transcriptional  errors that result from this process are unintentional.

## 2020-02-13 ENCOUNTER — Other Ambulatory Visit: Payer: Self-pay | Admitting: Gastroenterology

## 2020-02-13 ENCOUNTER — Other Ambulatory Visit (HOSPITAL_COMMUNITY): Payer: Self-pay | Admitting: Gastroenterology

## 2020-02-13 DIAGNOSIS — R1011 Right upper quadrant pain: Secondary | ICD-10-CM

## 2020-03-01 NOTE — Progress Notes (Signed)
[place] Phone: [phone] Subjective:   Kathryn [name], am serving as a scribe for Dr. Hulan Saas. This visit occurred during the SARS-CoV-2 public health emergency.  Safety protocols were in place, including screening questions prior to the visit, additional usage of staff PPE, and extensive cleaning of exam room while observing appropriate contact time as indicated for disinfecting solutions.   I'm seeing this patient by the request  of:  [name], MD  CC: Neck pain, wrist pain  [id number]   01/22/2020 Patient given a carpal tunnel injection to see if it will be beneficial.  May need to consider the possibility of osteopathic manipulation as well.  Patient is going to do more bracing on a regular basis as well as the gabapentin.  Follow-up with me again in 4 to 8 weeks  Update 03/05/2020 Kathryn [name] is a 54 y.o. female coming in with complaint of left wrist pain. Patient states that her pain is not during activity but at night when she is going to sleep. Did not have relief from injection. Does note history of neck pain on left side.   Did try gabapentin but did not notice a difference in wrist pain.   2017 Cervical xray IMPRESSION: Mild degenerative changes with mild straightening of the cervical spine. [name] acute or focal bony abnormality identified.     Past Medical History:  Diagnosis Date  . BREAST CANCER, HX OF 10/25/2008  . Cancer ([place])   . Family history of breast cancer   . Family history of breast cancer   . Family history of colon cancer   . Family history of stomach cancer   . HYPERLIPIDEMIA 10/25/2008   Past Surgical History:  Procedure Laterality Date  . APPENDECTOMY  2003  . CESAREAN SECTION  2004  . MASTECTOMY Bilateral 2007 and 2009  . TONSILLECTOMY  1974   Social History   Socioeconomic History  . Marital status: Married    Spouse name: Not on file  . Number of  children: Not on file  . Years of education: Not on file  . Highest education level: Not on file  Occupational History  . Occupation: Financial risk analyst: [employer]  Tobacco Use  . Smoking status: Never Smoker  . Smokeless tobacco: Never Used  Vaping Use  . Vaping Use: Never used  Substance and Sexual Activity  . Alcohol use: Yes    Alcohol/week: 0.0 standard drinks    Comment: 1-2 week  . Drug use: [name]  . Sexual activity: Not on file  Other Topics Concern  . Not on file  Social History Narrative  . Not on file   Social Determinants of Health   Financial Resource Strain:   . Difficulty of Paying Living Expenses: Not on file  Food Insecurity:   . Worried About Charity fundraiser in the Last Year: Not on file  . Ran Out of Food in the Last Year: Not on file  Transportation Needs:   . Lack of Transportation (Medical): Not on file  . Lack of Transportation (Non-Medical): Not on file  Physical Activity:   . Days of Exercise per Week: Not on file  . Minutes of Exercise per Session: Not on file  Stress:   . Feeling of Stress : Not on file  Social Connections:   . Frequency of Communication with Friends and Family: Not on file  . Frequency  of Social Gatherings with Friends and Family: Not on file  . Attends Religious Services: Not on file  . Active Member of Clubs or Organizations: Not on file  . Attends Archivist Meetings: Not on file  . Marital Status: Not on file   [name] Known Allergies Family History  Problem Relation Age of Onset  . Hyperlipidemia Mother   . Hypertension Mother   . Crohn's disease Mother   . Cancer Mother        gastric cancer  . Arthritis Other   . Hyperlipidemia Other   . Hypertension Other   . Aneurysm Maternal Aunt 40       brain  . Breast cancer Paternal Aunt        dx in her late 33s  . Colon cancer Paternal Uncle        dx and died in his 36s  . Mental illness Paternal Uncle        Current Outpatient  Medications (Analgesics):  .  meloxicam (MOBIC) 15 MG tablet, TAKE 1 TABLET BY MOUTH EVERY DAY   Current Outpatient Medications (Other):  .  BIOTIN PO, Take by mouth daily. Marland Kitchen  CALCIUM PO, Take by mouth daily. .  Ergocalciferol (VITAMIN D2) 2000 units TABS, Take 2,000 Units by mouth. .  Multiple Vitamin (MULTIVITAMIN) tablet, Take 1 tablet by mouth daily. .  Omega-3 Fatty Acids (FISH OIL PO), Take by mouth every other day. .  Probiotic Product (PROBIOTIC-10) CAPS, Take by mouth. Marland Kitchen  amoxicillin-clavulanate (AUGMENTIN) 875-125 MG tablet, Take 1 tablet by mouth 2 (two) times daily. Marland Kitchen  gabapentin (NEURONTIN) 100 MG capsule, Take 2 capsules (200 mg total) by mouth at bedtime.   Reviewed prior external information including notes and imaging from  primary care provider As well as notes that were available from care everywhere and other healthcare systems.  Past medical history, social, surgical and family history all reviewed in electronic medical record.  [name] pertanent information unless stated regarding to the chief complaint.   Review of Systems:  [name] headache, visual changes, nausea, vomiting, diarrhea, constipation, dizziness, abdominal pain, skin rash, fevers, chills, night sweats, weight loss, swollen lymph nodes, body aches, joint swelling, chest pain, shortness of breath, mood changes. POSITIVE muscle aches  Objective  Blood pressure 110/80, pulse 67, height 5\' 10"  (1.778 m), weight 164 lb (74.4 kg), SpO2 99 %.   General: [name] apparent distress alert and oriented x3 mood and affect normal, dressed appropriately.  HEENT: Pupils equal, extraocular movements intact  Respiratory: Patient's speak in full sentences and does not appear short of breath  Cardiovascular: [name] lower extremity edema, non tender, [name] erythema  Neuro: Cranial nerves II through XII are intact, neurovascularly intact in all extremities with 2+ DTRs and 2+ pulses.  Gait normal with good balance and coordination.  MSK:  Left wrist exam shows the patient does have some very minimal tenderness noted with certain range of motion.  Mild positive Watson.  Negative Tinel's.  Movement seems to be better.  Good grip strength.  Neck exam does have some very mild loss of lordosis.  Negative Spurling's though noted.    Impression and Recommendations:     The above documentation has been reviewed and is accurate and complete Kathryn Pulley, DO       Note: This dictation was prepared with Dragon dictation along with smaller phrase technology. Any transcriptional errors that result from this process are unintentional.

## 2020-03-05 ENCOUNTER — Other Ambulatory Visit: Payer: Self-pay

## 2020-03-05 ENCOUNTER — Encounter: Payer: Self-pay | Admitting: Family Medicine

## 2020-03-05 ENCOUNTER — Ambulatory Visit (INDEPENDENT_AMBULATORY_CARE_PROVIDER_SITE_OTHER): Payer: 59

## 2020-03-05 ENCOUNTER — Ambulatory Visit: Payer: 59 | Admitting: Family Medicine

## 2020-03-05 DIAGNOSIS — M25532 Pain in left wrist: Secondary | ICD-10-CM | POA: Diagnosis not present

## 2020-03-05 DIAGNOSIS — Z23 Encounter for immunization: Secondary | ICD-10-CM | POA: Diagnosis not present

## 2020-03-05 NOTE — Patient Instructions (Addendum)
Good to see you I will write you when I get the results If normal we will consider nerve conduction test Will write you when I get results

## 2020-03-05 NOTE — Assessment & Plan Note (Signed)
Patient continues to have chronic pain in the left wrist.  Did not respond as well to the carpal tunnel and does state that the nerve aspect may be a little bit better since injection.  With patient though not responding well and having this chronic chronically for many years and potentially slowly worsening I do feel an MR arthrogram for further evaluation would be helpful x-rays previously taken in June were independently visualized by me showing no significant bony abnormality.  Depending on findings we will discuss the possibility of a certain injections for surgical intervention.  If normal I would consider a nerve conduction test to further evaluate.  Patient is in agreement with plan.

## 2020-03-13 ENCOUNTER — Other Ambulatory Visit: Payer: Self-pay

## 2020-03-13 ENCOUNTER — Ambulatory Visit (HOSPITAL_COMMUNITY)
Admission: RE | Admit: 2020-03-13 | Discharge: 2020-03-13 | Disposition: A | Discharge status: Home / Self Care | Payer: 59 | Source: Ambulatory Visit | Attending: Gastroenterology | Admitting: Gastroenterology

## 2020-03-13 DIAGNOSIS — R1011 Right upper quadrant pain: Secondary | ICD-10-CM | POA: Insufficient documentation

## 2020-03-13 MED ORDER — TECHNETIUM TC 99M MEBROFENIN IV KIT
5.0000 | PACK | Freq: Once | INTRAVENOUS | Status: AC | PRN
  Administered 2020-03-13: 5 via INTRAVENOUS

## 2020-04-09 ENCOUNTER — Ambulatory Visit (INDEPENDENT_AMBULATORY_CARE_PROVIDER_SITE_OTHER): Payer: 59 | Admitting: Family Medicine

## 2020-04-09 ENCOUNTER — Ambulatory Visit
Admission: RE | Admit: 2020-04-09 | Discharge: 2020-04-09 | Disposition: A | Discharge status: Home / Self Care | Payer: 59 | Source: Ambulatory Visit | Attending: Family Medicine | Admitting: Family Medicine

## 2020-04-09 ENCOUNTER — Other Ambulatory Visit: Payer: Self-pay

## 2020-04-09 ENCOUNTER — Encounter: Payer: Self-pay | Admitting: Family Medicine

## 2020-04-09 VITALS — BP 114/70 | HR 47 | Temp 97.9°F | Ht 71.0 in | Wt 162.8 lb

## 2020-04-09 DIAGNOSIS — M25532 Pain in left wrist: Secondary | ICD-10-CM

## 2020-04-09 DIAGNOSIS — Z23 Encounter for immunization: Secondary | ICD-10-CM

## 2020-04-09 DIAGNOSIS — Z0000 Encounter for general adult medical examination without abnormal findings: Secondary | ICD-10-CM

## 2020-04-09 MED ORDER — IOPAMIDOL (ISOVUE-M 200) INJECTION 41%
2.0000 mL | Freq: Once | INTRAMUSCULAR | Status: AC
  Administered 2020-04-09: 2 mL via INTRA_ARTICULAR

## 2020-04-09 NOTE — Patient Instructions (Signed)

## 2020-04-09 NOTE — Progress Notes (Addendum)
Established Patient Office Visit  Subjective:  Patient ID: Kathryn [name], female    DOB: [birth year]  Age: 54 y.o. MRN: [id number]  CC:  Chief Complaint  Patient presents with  . Annual Exam    HPI Kathryn [name] presents for physical exam.  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  Her daughter will be finishing up high school soon.  She plans to sign with [employer] on a gymnastics scholarship.  Shirin has history of breast cancer.  She has had bilateral mastectomies.  She has family history of colon cancer.  Generally very healthy.  She exercises regularly.  She is very health-conscious with regard to diet.  Social history-non-smoker.  Married with 1 daughter.  Family history-reviewed with no changes  Health maintenance reviewed -Influenza vaccine already given -Last colonoscopy 11/18.  She is getting these every 5 years -Tetanus due 9/13 -Pap smears up-to-date per GYN- -no further mammograms with history of bilateral mastectomies -Patient had clinical infection with Covid apparently at some point as she had positive IgG antibodies.  She declines Covid vaccine -No history of hepatitis C screening but low risk -No history of shingles vaccine but she would like to get that  The 10-year ASCVD risk score Mikey Bussing DC Brooke Bonito., et al., 2013) is: 2.1%   Values used to calculate the score:     Age: 54 years     Sex: Female     Is Non-Hispanic African American: No     Diabetic: No     Tobacco smoker: No     Systolic Blood Pressure: 562 mmHg     Is BP treated: No     HDL Cholesterol: 53 mg/dL     Total Cholesterol: 250 mg/dL   Past Medical History:  Diagnosis Date  . BREAST CANCER, HX OF 10/25/2008  . Cancer ([place])   . Family history of breast cancer   . Family history of breast cancer   . Family history of colon cancer   . Family history of stomach cancer   . HYPERLIPIDEMIA 10/25/2008    Past Surgical History:  Procedure Laterality Date  .  APPENDECTOMY  2003  . CESAREAN SECTION  2004  . MASTECTOMY Bilateral 2007 and 2009  . TONSILLECTOMY  1974    Family History  Problem Relation Age of Onset  . Hyperlipidemia Mother   . Hypertension Mother   . Crohn's disease Mother   . Cancer Mother        gastric cancer  . Arthritis Other   . Hyperlipidemia Other   . Hypertension Other   . Aneurysm Maternal Aunt 40       brain  . Breast cancer Paternal Aunt        dx in her late 24s  . Colon cancer Paternal Uncle        dx and died in his 54s  . Mental illness Paternal Uncle     Social History   Socioeconomic History  . Marital status: Married    Spouse name: Not on file  . Number of children: Not on file  . Years of education: Not on file  . Highest education level: Not on file  Occupational History  . Occupation: Financial risk analyst: [employer]  Tobacco Use  . Smoking status: Never Smoker  . Smokeless tobacco: Never Used  Vaping Use  . Vaping Use: Never used  Substance and Sexual Activity  . Alcohol use: Yes  Alcohol/week: 0.0 standard drinks    Comment: 1-2 week  . Drug use: No  . Sexual activity: Not on file  Other Topics Concern  . Not on file  Social History Narrative  . Not on file   Social Determinants of Health   Financial Resource Strain:   . Difficulty of Paying Living Expenses: Not on file  Food Insecurity:   . Worried About Charity fundraiser in the Last Year: Not on file  . Ran Out of Food in the Last Year: Not on file  Transportation Needs:   . Lack of Transportation (Medical): Not on file  . Lack of Transportation (Non-Medical): Not on file  Physical Activity:   . Days of Exercise per Week: Not on file  . Minutes of Exercise per Session: Not on file  Stress:   . Feeling of Stress : Not on file  Social Connections:   . Frequency of Communication with Friends and Family: Not on file  . Frequency of Social Gatherings with Friends and Family: Not on file  .  Attends Religious Services: Not on file  . Active Member of Clubs or Organizations: Not on file  . Attends Archivist Meetings: Not on file  . Marital Status: Not on file  Intimate Partner Violence:   . Fear of Current or Ex-Partner: Not on file  . Emotionally Abused: Not on file  . Physically Abused: Not on file  . Sexually Abused: Not on file    Outpatient Medications Prior to Visit  Medication Sig Dispense Refill  . BIOTIN PO Take by mouth daily.    Marland Kitchen CALCIUM PO Take by mouth daily.    . Ergocalciferol (VITAMIN D2) 2000 units TABS Take 2,000 Units by mouth.    . Multiple Vitamin (MULTIVITAMIN) tablet Take 1 tablet by mouth daily.    . Omega-3 Fatty Acids (FISH OIL PO) Take by mouth every other day.    . Probiotic Product (PROBIOTIC-10) CAPS Take by mouth.    Marland Kitchen amoxicillin-clavulanate (AUGMENTIN) 875-125 MG tablet Take 1 tablet by mouth 2 (two) times daily. 20 tablet 0  . gabapentin (NEURONTIN) 100 MG capsule Take 2 capsules (200 mg total) by mouth at bedtime. 180 capsule 0  . meloxicam (MOBIC) 15 MG tablet TAKE 1 TABLET BY MOUTH EVERY DAY 30 tablet 0   No facility-administered medications prior to visit.    No Known Allergies  ROS Review of Systems  Constitutional: Negative for activity change, appetite change, fatigue, fever and unexpected weight change.  HENT: Negative for ear pain, hearing loss, sore throat and trouble swallowing.   Eyes: Negative for visual disturbance.  Respiratory: Negative for cough and shortness of breath.   Cardiovascular: Negative for chest pain and palpitations.  Gastrointestinal: Negative for abdominal pain, blood in stool, constipation and diarrhea.  Endocrine: Negative for polydipsia and polyuria.  Genitourinary: Negative for dysuria and hematuria.  Musculoskeletal: Negative for arthralgias, back pain and myalgias.  Skin: Negative for rash.  Neurological: Negative for dizziness, syncope and headaches.  Hematological: Negative for  adenopathy.  Psychiatric/Behavioral: Negative for confusion and dysphoric mood.      Objective:    Physical Exam Vitals reviewed.  Constitutional:      Appearance: Normal appearance.  HENT:     Right Ear: Tympanic membrane normal.     Left Ear: Tympanic membrane normal.  Cardiovascular:     Rate and Rhythm: Normal rate and regular rhythm.  Pulmonary:     Effort: Pulmonary effort is normal.  Breath sounds: Normal breath sounds.  Abdominal:     Palpations: Abdomen is soft.     Tenderness: There is no abdominal tenderness. There is no guarding or rebound.  Musculoskeletal:     Cervical back: Neck supple.     Right lower leg: No edema.     Left lower leg: No edema.  Lymphadenopathy:     Cervical: No cervical adenopathy.  Neurological:     General: No focal deficit present.     Mental Status: She is alert.     BP 114/70 (BP Location: Left Arm, Patient Position: Sitting, Cuff Size: Normal)   Pulse (!) 47   Temp 97.9 F (36.6 C) (Oral)   Ht 5\' 11"  (1.803 m)   Wt 162 lb 12.8 oz (73.8 kg)   SpO2 97%   BMI 22.71 kg/m  Wt Readings from Last 3 Encounters:  04/09/20 162 lb 12.8 oz (73.8 kg)  03/05/20 164 lb (74.4 kg)  01/22/20 164 lb (74.4 kg)     Health Maintenance Due  Topic Date Due  . Hepatitis C Screening  Never done  . HIV Screening  Never done    There are no preventive care reminders to display for this patient.  Lab Results  Component Value Date   TSH 3.61 03/24/2019   Lab Results  Component Value Date   WBC 4.4 12/05/2019   HGB 14.3 12/05/2019   HCT 41 12/05/2019   MCV 90.8 05/08/2019   PLT 244 12/05/2019   Lab Results  Component Value Date   NA 139 12/05/2019   K 4.3 12/05/2019   CO2 22 12/05/2019   GLUCOSE 94 05/08/2019   BUN 19 12/05/2019   CREATININE 0.8 12/05/2019   BILITOT 0.7 03/24/2019   ALKPHOS 66 03/24/2019   AST 25 12/05/2019   ALT 17 12/05/2019   PROT 6.4 03/24/2019   ALBUMIN 4.8 12/05/2019   CALCIUM 9.7 12/05/2019    ANIONGAP 9 05/08/2019   GFR 65.21 03/24/2019   Lab Results  Component Value Date   CHOL 233 (H) 03/24/2019   Lab Results  Component Value Date   HDL 50.50 03/24/2019   Lab Results  Component Value Date   LDLCALC 165 (H) 03/24/2019   Lab Results  Component Value Date   TRIG 88.0 03/24/2019   Lab Results  Component Value Date   CHOLHDL 5 03/24/2019   No results found for: HGBA1C    Assessment & Plan:   Problem List Items Addressed This Visit    None    Visit Diagnoses    Physical exam    -  Primary   Relevant Orders   Basic metabolic panel   Lipid panel   CBC with Differential/Platelet   TSH   Hepatic function panel   Hep C Antibody   Need for shingles vaccine       Relevant Orders   Varicella-zoster vaccine IM (Shingrix) (Completed)    -Discussed shingles vaccine and reviewed potential side effects.  She wished to go ahead and get her first today.  She is aware she may have some low-grade fevers or body aches or other milder side effects.  She is also aware to get booster in 2 to 6 months -Check lab work.  Include hepatitis C antibody, though she is low risk. -Continue with annual flu vaccine -Continue regular exercise habits  No orders of the defined types were placed in this encounter.   Follow-up: No follow-ups on file.    Carolann Littler, MD

## 2020-04-10 LAB — LIPID PANEL
Cholesterol: 250 mg/dL — ABNORMAL HIGH (ref <200)
HDL: 53 mg/dL (ref >=50)
LDL Cholesterol (Calc): 179 mg/dL (calc) — ABNORMAL HIGH
Non-HDL Cholesterol (Calc): 197 mg/dL (calc) — ABNORMAL HIGH (ref <130)
Total CHOL/HDL Ratio: 4.7 (calc) (ref <5.0)
Triglycerides: 78 mg/dL (ref <150)

## 2020-04-10 LAB — HEPATIC FUNCTION PANEL
AG Ratio: 2.1 (calc) (ref 1.0–2.5)
ALT: 17 U/L (ref 6–29)
AST: 24 U/L (ref 10–35)
Albumin: 4.6 g/dL (ref 3.6–5.1)
Alkaline phosphatase (APISO): 70 U/L (ref 37–153)
Bilirubin, Direct: 0.1 mg/dL (ref 0.0–0.2)
Globulin: 2.2 g/dL (calc) (ref 1.9–3.7)
Indirect Bilirubin: 0.7 mg/dL (calc) (ref 0.2–1.2)
Total Bilirubin: 0.8 mg/dL (ref 0.2–1.2)
Total Protein: 6.8 g/dL (ref 6.1–8.1)

## 2020-04-10 LAB — TSH: TSH: 3.49 mIU/L

## 2020-04-10 LAB — BASIC METABOLIC PANEL
BUN: 17 mg/dL (ref 7–25)
CO2: 28 mmol/L (ref 20–32)
Calcium: 9.4 mg/dL (ref 8.6–10.4)
Chloride: 102 mmol/L (ref 98–110)
Creat: 0.99 mg/dL (ref 0.50–1.05)
Glucose, Bld: 82 mg/dL (ref 65–99)
Potassium: 4.1 mmol/L (ref 3.5–5.3)
Sodium: 140 mmol/L (ref 135–146)

## 2020-04-10 LAB — CBC WITH DIFFERENTIAL/PLATELET
Absolute Monocytes: 312 cells/uL (ref 200–950)
Basophils Absolute: 19 cells/uL (ref 0–200)
Basophils Relative: 0.5 %
Eosinophils Absolute: 19 cells/uL (ref 15–500)
Eosinophils Relative: 0.5 %
HCT: 41.3 % (ref 35.0–45.0)
Hemoglobin: 14.6 g/dL (ref 11.7–15.5)
Lymphs Abs: 1110 cells/uL (ref 850–3900)
MCH: 32.8 pg (ref 27.0–33.0)
MCHC: 35.4 g/dL (ref 32.0–36.0)
MCV: 92.8 fL (ref 80.0–100.0)
MPV: 11.1 fL (ref 7.5–12.5)
Monocytes Relative: 8.2 %
Neutro Abs: 2341 cells/uL (ref 1500–7800)
Neutrophils Relative %: 61.6 %
Platelets: 257 10*3/uL (ref 140–400)
RBC: 4.45 10*6/uL (ref 3.80–5.10)
RDW: 11.8 % (ref 11.0–15.0)
Total Lymphocyte: 29.2 %
WBC: 3.8 10*3/uL (ref 3.8–10.8)

## 2020-04-10 LAB — HEPATITIS C ANTIBODY
Hepatitis C Ab: NONREACTIVE
SIGNAL TO CUT-OFF: 0.01 (ref <1.00)

## 2020-04-22 ENCOUNTER — Other Ambulatory Visit: Payer: Self-pay

## 2020-04-22 ENCOUNTER — Telehealth: Payer: Self-pay | Admitting: *Deleted

## 2020-04-22 DIAGNOSIS — E7800 Pure hypercholesterolemia, unspecified: Secondary | ICD-10-CM

## 2020-04-22 NOTE — Telephone Encounter (Signed)
Patient called wanting to talk to a nurse regarding her lab results. Patient states she is concerned.

## 2020-04-22 NOTE — Telephone Encounter (Signed)
Spoke with pt aware that Dr Elease Hashimoto advise for pt to have lab repeat for Elevated Cholesterol, pt scheduled for lab on 09/2020. Order placed in [place]

## 2020-04-22 NOTE — Telephone Encounter (Signed)
Okay to recheck lipids in 6 months.  Make sure this is fasting.

## 2020-04-22 NOTE — Telephone Encounter (Signed)
Pt states that she is concerned with her cholesterol numbers, state that she has been eating healthy but the numbers keep going up, pt state that she will work on her diet and exercise for the next 6 months, pt wants advise on  if its ok for her to have her cholesterol rechecked in 6 months. Please advice

## 2020-08-05 NOTE — Addendum Note (Signed)
Addended by: Marrion Coy on: 08/05/2020 01:34 PM   Modules accepted: Orders

## 2020-09-11 ENCOUNTER — Ambulatory Visit: Payer: 59 | Admitting: Orthopaedic Surgery

## 2020-09-11 ENCOUNTER — Other Ambulatory Visit: Payer: Self-pay

## 2020-09-11 ENCOUNTER — Ambulatory Visit: Payer: Self-pay

## 2020-09-11 ENCOUNTER — Encounter: Payer: Self-pay | Admitting: Orthopaedic Surgery

## 2020-09-11 DIAGNOSIS — M25522 Pain in left elbow: Secondary | ICD-10-CM | POA: Diagnosis not present

## 2020-09-11 DIAGNOSIS — M25562 Pain in left knee: Secondary | ICD-10-CM | POA: Diagnosis not present

## 2020-09-11 DIAGNOSIS — G8929 Other chronic pain: Secondary | ICD-10-CM | POA: Diagnosis not present

## 2020-09-11 MED ORDER — BUPIVACAINE HCL 0.25 % IJ SOLN
2.0000 mL | INTRAMUSCULAR | Status: AC | PRN
  Administered 2020-09-11: 2 mL via INTRA_ARTICULAR

## 2020-09-11 MED ORDER — BUPIVACAINE HCL 0.25 % IJ SOLN
0.3300 mL | INTRAMUSCULAR | Status: AC | PRN
  Administered 2020-09-11: .33 mL

## 2020-09-11 MED ORDER — METHYLPREDNISOLONE ACETATE 40 MG/ML IJ SUSP
40.0000 mg | INTRAMUSCULAR | Status: AC | PRN
  Administered 2020-09-11: 40 mg via INTRA_ARTICULAR

## 2020-09-11 MED ORDER — METHYLPREDNISOLONE ACETATE 40 MG/ML IJ SUSP
40.0000 mg | INTRAMUSCULAR | Status: AC | PRN
  Administered 2020-09-11: 40 mg

## 2020-09-11 MED ORDER — LIDOCAINE HCL 1 % IJ SOLN
2.0000 mL | INTRAMUSCULAR | Status: AC | PRN
  Administered 2020-09-11: 2 mL

## 2020-09-11 MED ORDER — LIDOCAINE HCL 1 % IJ SOLN
1.0000 mL | INTRAMUSCULAR | Status: AC | PRN
  Administered 2020-09-11: 1 mL

## 2020-09-11 NOTE — Progress Notes (Signed)
Office Visit Note   Patient: Kathryn [name]           Date of Birth: October 14, 1965           MRN: [id number] Visit Date: 09/11/2020              Requested by: [name], MD [place],  [place] [id number] PCP: [name], MD   Assessment & Plan: Visit Diagnoses:  1. Chronic pain of left knee   2. Pain in left elbow     Plan: Impression is left knee patellofemoral osteoarthritis and left elbow medial epicondylitis.  In regards to the left knee and left elbow, we have discussed cortisone injection for which she would like to proceed.  We have also provided her with flexor tendon stretches for the elbow.  She will try to back off activity as much as possible.  Follow-up with [place] as needed.  Follow-Up Instructions: Return if symptoms worsen or fail to improve.   Orders:  Orders Placed This Encounter  Procedures  . Large Joint Inj: L knee  . Hand/UE Inj: L elbow  . XR KNEE 3 VIEW LEFT  . XR Elbow Complete Left (3+View)   No orders of the defined types were placed in this encounter.     Procedures: Large Joint Inj: L knee on 09/11/2020 9:12 AM Indications: pain Details: 22 G needle, anterolateral approach Medications: 2 mL lidocaine 1 %; 2 mL bupivacaine 0.25 %; 40 mg methylPREDNISolone acetate 40 MG/ML  Hand/UE Inj: L elbow for medial epicondylitis on 09/11/2020 9:13 AM Indications: pain Details: 22 G needle Medications: 1 mL lidocaine 1 %; 0.33 mL bupivacaine 0.25 %; 40 mg methylPREDNISolone acetate 40 MG/ML      Clinical Data: No additional findings.   Subjective: Chief Complaint  Patient presents with  . Left Knee - Pain  . Left Elbow - Pain    HPI patient is a very pleasant 55 year old female who comes in today with left knee and left elbow pain.  In regards to her left knee, this has been bothering her for the past 5 to 6 months.  No specific injury.  She does note that she has been a competitive runner for many years and runs  and works out many days of the week.  The pain she has is primarily to the posterior medial aspect.  She has associated but occasional swelling, locking and popping.  Her symptoms are worse with running as well as going downstairs.  She does get improvement of symptoms after resting and applying ice.  She tries not to take any over-the-counter NSAIDs due to previous diagnosis of cancer.  She has not previously had a cortisone injection to the left knee.  In regards to her left elbow, she has had pain to the medial aspect for the past 18 months or so which has progressively worsened.  No specific injury to the elbow.  She has increased pain with triceps, push downs as well as sleeping.  She does get improvement with rest.  She notes occasional numbness and tingling throughout the ulnar nerve distribution.  Review of Systems as detailed in HPI.  All others reviewed and are negative.   Objective: Vital Signs: There were no vitals taken for this visit.  Physical Exam well-developed well-nourished female no acute distress.  Alert oriented x3.  Ortho Exam left knee exam shows no effusion.  Range of motion 0 to 125 degrees.  No joint line tenderness.  No tenderness of the popliteal fossa.  Ligaments are stable.  Moderate patellofemoral crepitus.  Left elbow exam shows moderate tenderness to the medial epicondyle.  No tenderness to the lateral epicondyle or radial tunnel.  No tenderness to the olecranon.  Full strength with resisted elbow flexion and extension.  No pain with supination, pronation, wrist extension or flexion.  She is neurovascular intact distally.  Specialty Comments:  No specialty comments available.  Imaging: XR Elbow Complete Left (3+View)  Result Date: 09/11/2020 X-rays demonstrate calcifications in the triceps tendon  XR KNEE 3 VIEW LEFT  Result Date: 09/11/2020 X-rays demonstrate ossifications within the quadriceps tendon.  Moderate degenerative changes to the lateral femoral  compartment with periarticular osteophytes    PMFS History: Patient Active Problem List   Diagnosis Date Noted  . Left wrist pain 03/05/2020  . Carpal tunnel syndrome 01/22/2020  . Left arm pain 12/18/2019  . Neck pain of over 3 months duration 01/14/2016  . Nonallopathic lesion of cervical region 01/14/2016  . Nonallopathic lesion of thoracic region 01/14/2016  . Nonallopathic lesion of rib cage 01/14/2016  . Family history of stomach cancer   . Family history of breast cancer   . Family history of colon cancer   . Atypical chest pain 04/20/2013  . HYPERLIPIDEMIA 10/25/2008  . BREAST CANCER, HX OF 10/25/2008   Past Medical History:  Diagnosis Date  . BREAST CANCER, HX OF 10/25/2008  . Cancer ([place])   . Family history of breast cancer   . Family history of breast cancer   . Family history of colon cancer   . Family history of stomach cancer   . HYPERLIPIDEMIA 10/25/2008    Family History  Problem Relation Age of Onset  . Hyperlipidemia Mother   . Hypertension Mother   . Crohn's disease Mother   . Cancer Mother        gastric cancer  . Arthritis Other   . Hyperlipidemia Other   . Hypertension Other   . Aneurysm Maternal Aunt 40       brain  . Breast cancer Paternal Aunt        dx in her late 98s  . Colon cancer Paternal Uncle        dx and died in his 87s  . Mental illness Paternal Uncle     Past Surgical History:  Procedure Laterality Date  . APPENDECTOMY  2003  . CESAREAN SECTION  2004  . MASTECTOMY Bilateral 2007 and 2009  . TONSILLECTOMY  1974   Social History   Occupational History  . Occupation: Financial risk analyst: [employer]  Tobacco Use  . Smoking status: Never Smoker  . Smokeless tobacco: Never Used  Vaping Use  . Vaping Use: Never used  Substance and Sexual Activity  . Alcohol use: Yes    Alcohol/week: 0.0 standard drinks    Comment: 1-2 week  . Drug use: No  . Sexual activity: Not on file

## 2020-09-19 ENCOUNTER — Telehealth: Payer: Self-pay | Admitting: Family Medicine

## 2020-09-19 NOTE — Telephone Encounter (Signed)
Pt want to know if she can get her 2nd  Shingle shot when she come for her labs tomorrow at 8:00 .

## 2020-09-20 ENCOUNTER — Other Ambulatory Visit (INDEPENDENT_AMBULATORY_CARE_PROVIDER_SITE_OTHER): Payer: 59

## 2020-09-20 ENCOUNTER — Other Ambulatory Visit: Payer: Self-pay

## 2020-09-20 DIAGNOSIS — E7800 Pure hypercholesterolemia, unspecified: Secondary | ICD-10-CM | POA: Diagnosis not present

## 2020-09-20 DIAGNOSIS — Z23 Encounter for immunization: Secondary | ICD-10-CM

## 2020-09-20 LAB — LIPID PANEL
Cholesterol: 247 mg/dL — ABNORMAL HIGH (ref 0–200)
HDL: 48.9 mg/dL (ref >39.00)
LDL Cholesterol: 171 mg/dL — ABNORMAL HIGH (ref 0–99)
NonHDL: 198.53
Total CHOL/HDL Ratio: 5
Triglycerides: 140 mg/dL (ref 0.0–149.0)
VLDL: 28 mg/dL (ref 0.0–40.0)

## 2020-09-20 NOTE — Telephone Encounter (Signed)
Noted. The patient was made aware she could get this vaccine when she came in for labs.

## 2020-09-20 NOTE — Telephone Encounter (Signed)
This sounds more like a nursing question/issue.  If someone is available to give her shingles vaccine she should be able to get then

## 2020-09-20 NOTE — Progress Notes (Signed)
Per orders of Dr. Elease Hashimoto, injection of Shingrix given by Rebecca Eaton. Patient tolerated injection well.

## 2020-09-20 NOTE — Addendum Note (Signed)
Addended by: Rebecca Eaton on: 09/20/2020 09:05 AM   Modules accepted: Orders

## 2020-10-07 ENCOUNTER — Telehealth: Payer: Self-pay

## 2020-10-07 DIAGNOSIS — M25522 Pain in left elbow: Secondary | ICD-10-CM

## 2020-10-07 NOTE — Telephone Encounter (Signed)
Patient called she is requesting a referral for a mri to be sent to [facility] for her left elbow call back:[phone]

## 2020-10-07 NOTE — Telephone Encounter (Signed)
Please advise 

## 2020-10-08 NOTE — Telephone Encounter (Signed)
Ok that's fine. MRI left elbow r/o structural abnormalities.

## 2020-10-08 NOTE — Telephone Encounter (Signed)
Mri ordered. Pt was informed

## 2020-10-08 NOTE — Addendum Note (Signed)
Addended by: Robyne Peers on: 10/08/2020 09:10 AM   Modules accepted: Orders

## 2020-10-18 ENCOUNTER — Ambulatory Visit
Admission: RE | Admit: 2020-10-18 | Discharge: 2020-10-18 | Disposition: A | Discharge status: Home / Self Care | Payer: 59 | Source: Ambulatory Visit | Attending: Orthopaedic Surgery | Admitting: Orthopaedic Surgery

## 2020-10-18 DIAGNOSIS — M25522 Pain in left elbow: Secondary | ICD-10-CM

## 2020-10-18 NOTE — Progress Notes (Signed)
Needs appt to review MRI.  Thanks.

## 2020-10-29 ENCOUNTER — Ambulatory Visit: Payer: 59 | Admitting: Orthopaedic Surgery

## 2020-10-29 DIAGNOSIS — M25522 Pain in left elbow: Secondary | ICD-10-CM | POA: Diagnosis not present

## 2020-10-29 NOTE — Progress Notes (Signed)
Office Visit Note   Patient: Kathryn [name]           Date of Birth: 04/30/1966           MRN: [id number] Visit Date: 10/29/2020              Requested by: [name], MD [place],  [place] [address] PCP: [name], MD   Assessment & Plan: Visit Diagnoses:  1. Pain in left elbow     Plan: MRI shows mild tendinopathy of the common flexor origin and mild chondrosis of the elbow joint.  There is mild thickening and increased signal of the ulnar nerve proximal to the cubital tunnel.  These findings were reviewed with Noralee I think overall it is consistent overuse injuries.  She is not really reporting ulnar nerve symptoms other than occasional numbness and tingling in the small finger.  At this point I think the symptoms are relatively mild do not need any invasive treatments.  As she has learned to listen to her body and just take it easy.  I will see her back as needed.  Follow-Up Instructions: Return if symptoms worsen or fail to improve.   Orders:  No orders of the defined types were placed in this encounter.  No orders of the defined types were placed in this encounter.     Procedures: No procedures performed   Clinical Data: No additional findings.   Subjective: Chief Complaint  Patient presents with  . Other     Scan review    Joe returns today for MRI review of the left elbow.  Overall the pain is mild and manageable and mainly only symptomatic with increased activity.   Review of Systems   Objective: Vital Signs: There were no vitals taken for this visit.  Physical Exam  Ortho Exam Left elbow shows full range of motion.  Slight tenderness to the medial epicondyle.  Strength intact.  Negative Tinel at the cubital tunnel.  Sensation intact to the ulnar nerve. Specialty Comments:  No specialty comments available.  Imaging: No results found.   PMFS History: Patient Active Problem List   Diagnosis Date Noted  .  Left wrist pain 03/05/2020  . Carpal tunnel syndrome 01/22/2020  . Left arm pain 12/18/2019  . Neck pain of over 3 months duration 01/14/2016  . Nonallopathic lesion of cervical region 01/14/2016  . Nonallopathic lesion of thoracic region 01/14/2016  . Nonallopathic lesion of rib cage 01/14/2016  . Family history of stomach cancer   . Family history of breast cancer   . Family history of colon cancer   . Atypical chest pain 04/20/2013  . HYPERLIPIDEMIA 10/25/2008  . BREAST CANCER, HX OF 10/25/2008   Past Medical History:  Diagnosis Date  . BREAST CANCER, HX OF 10/25/2008  . Cancer ([place])   . Family history of breast cancer   . Family history of breast cancer   . Family history of colon cancer   . Family history of stomach cancer   . HYPERLIPIDEMIA 10/25/2008    Family History  Problem Relation Age of Onset  . Hyperlipidemia Mother   . Hypertension Mother   . Crohn's disease Mother   . Cancer Mother        gastric cancer  . Arthritis Other   . Hyperlipidemia Other   . Hypertension Other   . Aneurysm Maternal Aunt 40       brain  . Breast cancer Paternal Aunt  dx in her late 70s  . Colon cancer Paternal Uncle        dx and died in his 35s  . Mental illness Paternal Uncle     Past Surgical History:  Procedure Laterality Date  . APPENDECTOMY  2003  . CESAREAN SECTION  2004  . MASTECTOMY Bilateral 2007 and 2009  . TONSILLECTOMY  1974   Social History   Occupational History  . Occupation: Financial risk analyst: [employer]  Tobacco Use  . Smoking status: Never Smoker  . Smokeless tobacco: Never Used  Vaping Use  . Vaping Use: Never used  Substance and Sexual Activity  . Alcohol use: Yes    Alcohol/week: 0.0 standard drinks    Comment: 1-2 week  . Drug use: No  . Sexual activity: Not on file

## 2020-11-12 ENCOUNTER — Other Ambulatory Visit: Payer: Self-pay

## 2020-11-12 ENCOUNTER — Encounter: Payer: Self-pay | Admitting: Family Medicine

## 2020-11-12 ENCOUNTER — Ambulatory Visit (INDEPENDENT_AMBULATORY_CARE_PROVIDER_SITE_OTHER): Payer: 59 | Admitting: Family Medicine

## 2020-11-12 VITALS — BP 118/80 | HR 60 | Temp 98.1°F | Wt 178.2 lb

## 2020-11-12 DIAGNOSIS — J40 Bronchitis, not specified as acute or chronic: Secondary | ICD-10-CM

## 2020-11-12 MED ORDER — AZITHROMYCIN 250 MG PO TABS: ORAL_TABLET | ORAL | 0 refills | Status: DC

## 2020-11-12 NOTE — Progress Notes (Signed)
   Subjective:    Patient ID: Kathryn [name], female    DOB: Nov 02, 1965, 55 y.o.   MRN: [id number]  HPI Here for 6 days of chest congestion and a dry cough. She had a low grade fever at first but not now. No body aches or NVD. No chest pain or SOB. Taking Robitussin DM. She has tested negative for the Covid virus a few days ago.    Review of Systems  Constitutional: Negative.   HENT: Negative.   Eyes: Negative.   Respiratory: Positive for cough. Negative for shortness of breath and wheezing.   Cardiovascular: Negative.   Gastrointestinal: Negative.        Objective:   Physical Exam Constitutional:      Appearance: Normal appearance. She is not ill-appearing.  HENT:     Right Ear: Tympanic membrane, ear canal and external ear normal.     Left Ear: Tympanic membrane, ear canal and external ear normal.     Nose: Nose normal.     Mouth/Throat:     Pharynx: Oropharynx is clear.  Eyes:     Conjunctiva/sclera: Conjunctivae normal.  Pulmonary:     Effort: Pulmonary effort is normal. No respiratory distress.     Breath sounds: Rhonchi present. No wheezing or rales.  Lymphadenopathy:     Cervical: No cervical adenopathy.  Neurological:     Mental Status: She is alert.           Assessment & Plan:  Bronchitis, we will treat with a Zpack. Recheck as needed.  Alysia Penna, MD

## 2021-01-29 ENCOUNTER — Telehealth: Payer: Self-pay

## 2021-01-29 NOTE — Telephone Encounter (Signed)
Patient called to schedule physical 10/21 and would like to know if labs can be drawn a month before visit pt would like a call back.

## 2021-01-31 NOTE — Telephone Encounter (Signed)
Noted  

## 2021-01-31 NOTE — Telephone Encounter (Signed)
Left message for patient to call back  

## 2021-01-31 NOTE — Telephone Encounter (Signed)
Patient returned call and stated she would call insurance and give the office a call back.

## 2021-02-18 ENCOUNTER — Encounter: Payer: Self-pay | Admitting: Family Medicine

## 2021-02-18 ENCOUNTER — Other Ambulatory Visit: Payer: Self-pay

## 2021-02-18 ENCOUNTER — Ambulatory Visit: Payer: 59 | Admitting: Family Medicine

## 2021-02-18 VITALS — BP 110/60 | HR 65 | Temp 98.1°F | Wt 174.7 lb

## 2021-02-18 DIAGNOSIS — S8011XA Contusion of right lower leg, initial encounter: Secondary | ICD-10-CM | POA: Diagnosis not present

## 2021-02-18 DIAGNOSIS — S80811A Abrasion, right lower leg, initial encounter: Secondary | ICD-10-CM | POA: Diagnosis not present

## 2021-02-18 MED ORDER — CEPHALEXIN 500 MG PO CAPS: 500.0000 mg | ORAL_CAPSULE | Freq: Four times a day (QID) | ORAL | 0 refills | Status: DC

## 2021-02-18 NOTE — Progress Notes (Signed)
Established Patient Office Visit  Subjective:  Patient ID: Kathryn [name], female    DOB: February 05, 1966  Age: 55 y.o. MRN: [id number]  CC:  Chief Complaint  Patient presents with   Laceration    Pt hit shin on a rock while tubing, very painful, starting to scab    HPI Kathryn [name] presents for right leg pain and swelling along the mid anterior shin.  She states that she was with her family last Friday and was tubing.  At 1 point the [place] were pulling her tube away and she was unable to hold on and was pulled down into a rock striking her left and right shins.  Her major injury was to her right shin-though she has some bruising of the left as well.  She applied some ice and took some Advil immediately.  She is able to ambulate but does have some soreness.  She has curtailed her usual exercise activities.  She was concerned because a little bit of mild erythema around the eschar right leg wound.  Tetanus up-to-date for 1 more year.  Past Medical History:  Diagnosis Date   BREAST CANCER, HX OF 10/25/2008   Cancer ([facility])    Family history of breast cancer    Family history of breast cancer    Family history of colon cancer    Family history of stomach cancer    HYPERLIPIDEMIA 10/25/2008    Past Surgical History:  Procedure Laterality Date   APPENDECTOMY  2003   CESAREAN SECTION  2004   MASTECTOMY Bilateral 2007 and 2009   TONSILLECTOMY  1974    Family History  Problem Relation Age of Onset   Hyperlipidemia Mother    Hypertension Mother    Crohn's disease Mother    Cancer Mother        gastric cancer   Arthritis Other    Hyperlipidemia Other    Hypertension Other    Aneurysm Maternal Aunt 50       brain   Breast cancer Paternal Aunt        dx in her late 49s   Colon cancer Paternal Uncle        dx and died in his 15s   Mental illness Paternal Uncle     Social History   Socioeconomic History   Marital status: Married    Spouse name: Not on file   Number of children:  Not on file   Years of education: Not on file   Highest education level: Not on file  Occupational History   Occupation: Financial risk analyst: [employer]  Tobacco Use   Smoking status: Never   Smokeless tobacco: Never  Vaping Use   Vaping Use: Never used  Substance and Sexual Activity   Alcohol use: Yes    Alcohol/week: 0.0 standard drinks    Comment: 1-2 week   Drug use: No   Sexual activity: Not on file  Other Topics Concern   Not on file  Social History Narrative   Not on file   Social Determinants of Health   Financial Resource Strain: Not on file  Food Insecurity: Not on file  Transportation Needs: Not on file  Physical Activity: Not on file  Stress: Not on file  Social Connections: Not on file  Intimate Partner Violence: Not on file    Outpatient Medications Prior to Visit  Medication Sig Dispense Refill   azithromycin (ZITHROMAX Z-PAK) 250 MG tablet As directed 6 each  0   BIOTIN PO Take by mouth daily.     CALCIUM PO Take by mouth daily.     Ergocalciferol (VITAMIN D2) 2000 units TABS Take 2,000 Units by mouth.     Multiple Vitamin (MULTIVITAMIN) tablet Take 1 tablet by mouth daily.     Omega-3 Fatty Acids (FISH OIL PO) Take by mouth every other day.     Probiotic Product (PROBIOTIC-10) CAPS Take by mouth.     No facility-administered medications prior to visit.    No Known Allergies  ROS Review of Systems  Constitutional:  Negative for chills and fever.     Objective:    Physical Exam Vitals reviewed.  Constitutional:      Appearance: Normal appearance.  Cardiovascular:     Rate and Rhythm: Normal rate.  Skin:    Comments: She has some bruising of the left anterior leg.  She has eschar right anterior leg with very small surrounding zone of erythema of just couple millimeters.  She has evidence for some visible swelling around the wound but no fluctuance.  May have very small hematoma.  Very tender to palpation.  No purulent  drainage.  Neurological:     Mental Status: She is alert.    BP 110/60 (BP Location: Left Arm, Patient Position: Sitting, Cuff Size: Normal)   Pulse 65   Temp 98.1 F (36.7 C) (Oral)   Wt 174 lb 11.2 oz (79.2 kg)   SpO2 98%   BMI 24.37 kg/m  Wt Readings from Last 3 Encounters:  02/18/21 174 lb 11.2 oz (79.2 kg)  11/12/20 178 lb 3.2 oz (80.8 kg)  04/09/20 162 lb 12.8 oz (73.8 kg)     Health Maintenance Due  Topic Date Due   COVID-19 Vaccine (1) Never done   HIV Screening  Never done   INFLUENZA VACCINE  01/20/2021    There are no preventive care reminders to display for this patient.  Lab Results  Component Value Date   TSH 3.49 04/09/2020   Lab Results  Component Value Date   WBC 3.8 04/09/2020   HGB 14.6 04/09/2020   HCT 41.3 04/09/2020   MCV 92.8 04/09/2020   PLT 257 04/09/2020   Lab Results  Component Value Date   NA 140 04/09/2020   K 4.1 04/09/2020   CO2 28 04/09/2020   GLUCOSE 82 04/09/2020   BUN 17 04/09/2020   CREATININE 0.99 04/09/2020   BILITOT 0.8 04/09/2020   ALKPHOS 66 03/24/2019   AST 24 04/09/2020   ALT 17 04/09/2020   PROT 6.8 04/09/2020   ALBUMIN 4.8 12/05/2019   CALCIUM 9.4 04/09/2020   ANIONGAP 9 05/08/2019   GFR 65.21 03/24/2019   Lab Results  Component Value Date   CHOL 247 (H) 09/20/2020   Lab Results  Component Value Date   HDL 48.90 09/20/2020   Lab Results  Component Value Date   LDLCALC 171 (H) 09/20/2020   Lab Results  Component Value Date   TRIG 140.0 09/20/2020   Lab Results  Component Value Date   CHOLHDL 5 09/20/2020   No results found for: HGBA1C    Assessment & Plan:   Problem List Items Addressed This Visit   None Visit Diagnoses     Contusion of right lower extremity, initial encounter    -  Primary   Abrasion of right lower extremity, initial encounter         Patient has fairly deep abrasion right anterior leg with contusion but currently no  signs of significant cellulitis.  Suspect very  small zone of erythema around the wound is related to normal healing process.  Continue with elevation and icing.  We did write for limited Keflex to start if she has any progressive erythema or other concerns.  Reviewed wound care with soap and water and apply little bit of topical Vaseline and leave off peroxide  We did discuss possible x-rays but do not suspect significant fracture she is weightbearing currently without difficulty.  Meds ordered this encounter  Medications   cephALEXin (KEFLEX) 500 MG capsule    Sig: Take 1 capsule (500 mg total) by mouth 4 (four) times daily.    Dispense:  28 capsule    Refill:  0    Follow-up: No follow-ups on file.    Carolann Littler, MD

## 2021-02-18 NOTE — Patient Instructions (Signed)
Keep wound clean with soap and water  Use some topical vaseline once or twice daily  Start the Keflex for any progressive redness, warmth, pus like drainage.

## 2021-04-03 IMAGING — DX DG ELBOW 2V*L*
2 series · 2 of 2 positions shown · non-contrast
Comparison: None.

CLINICAL DATA: Chronic left elbow pain without known injury.

EXAM:
LEFT ELBOW - 2 VIEW

[elbow ap]
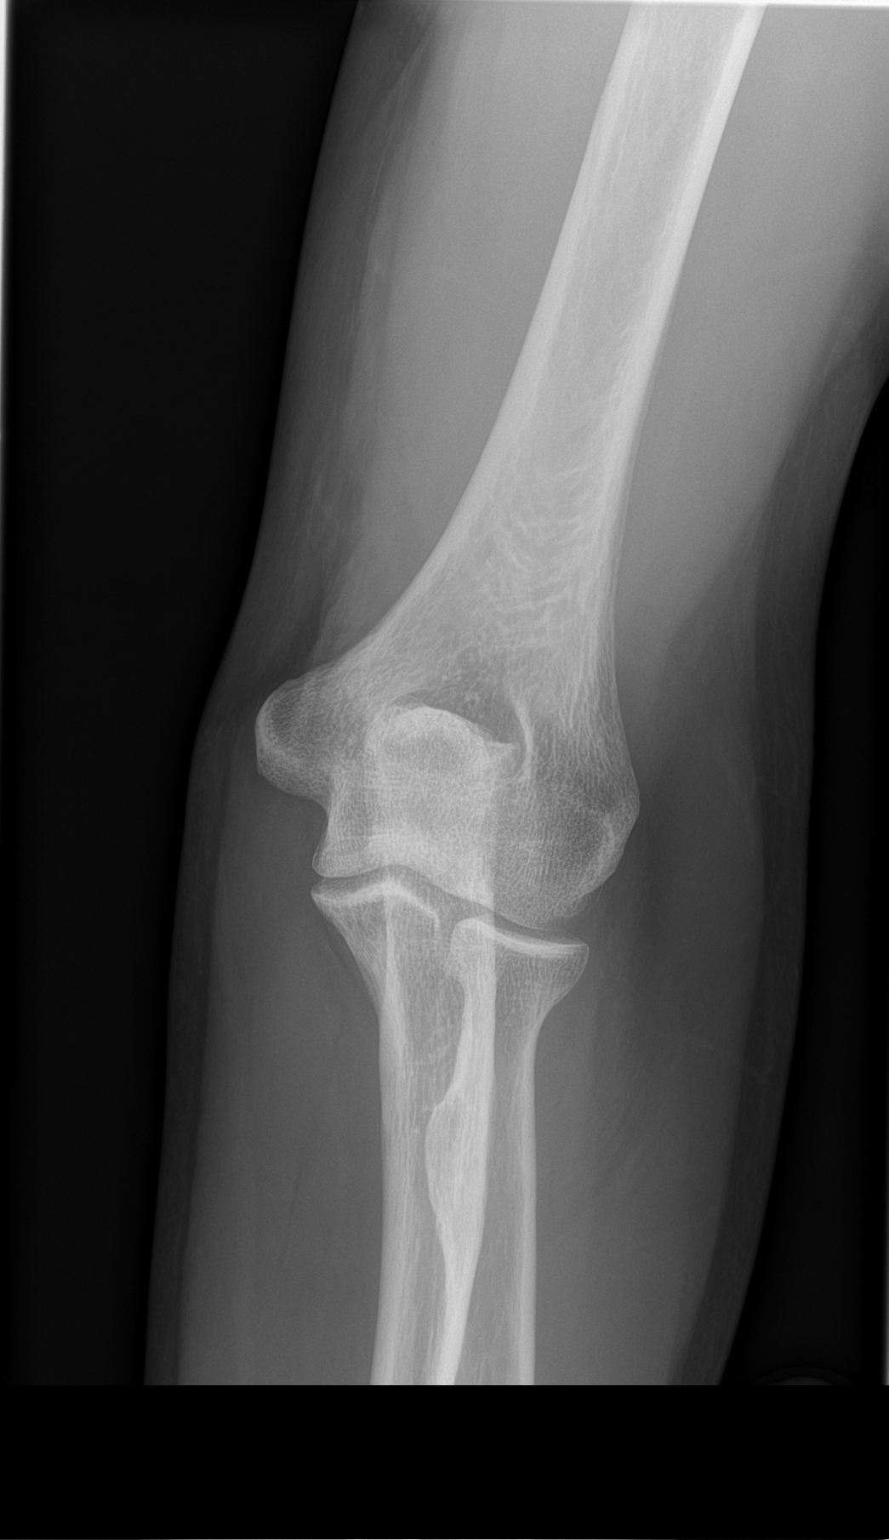

[elbow lat]
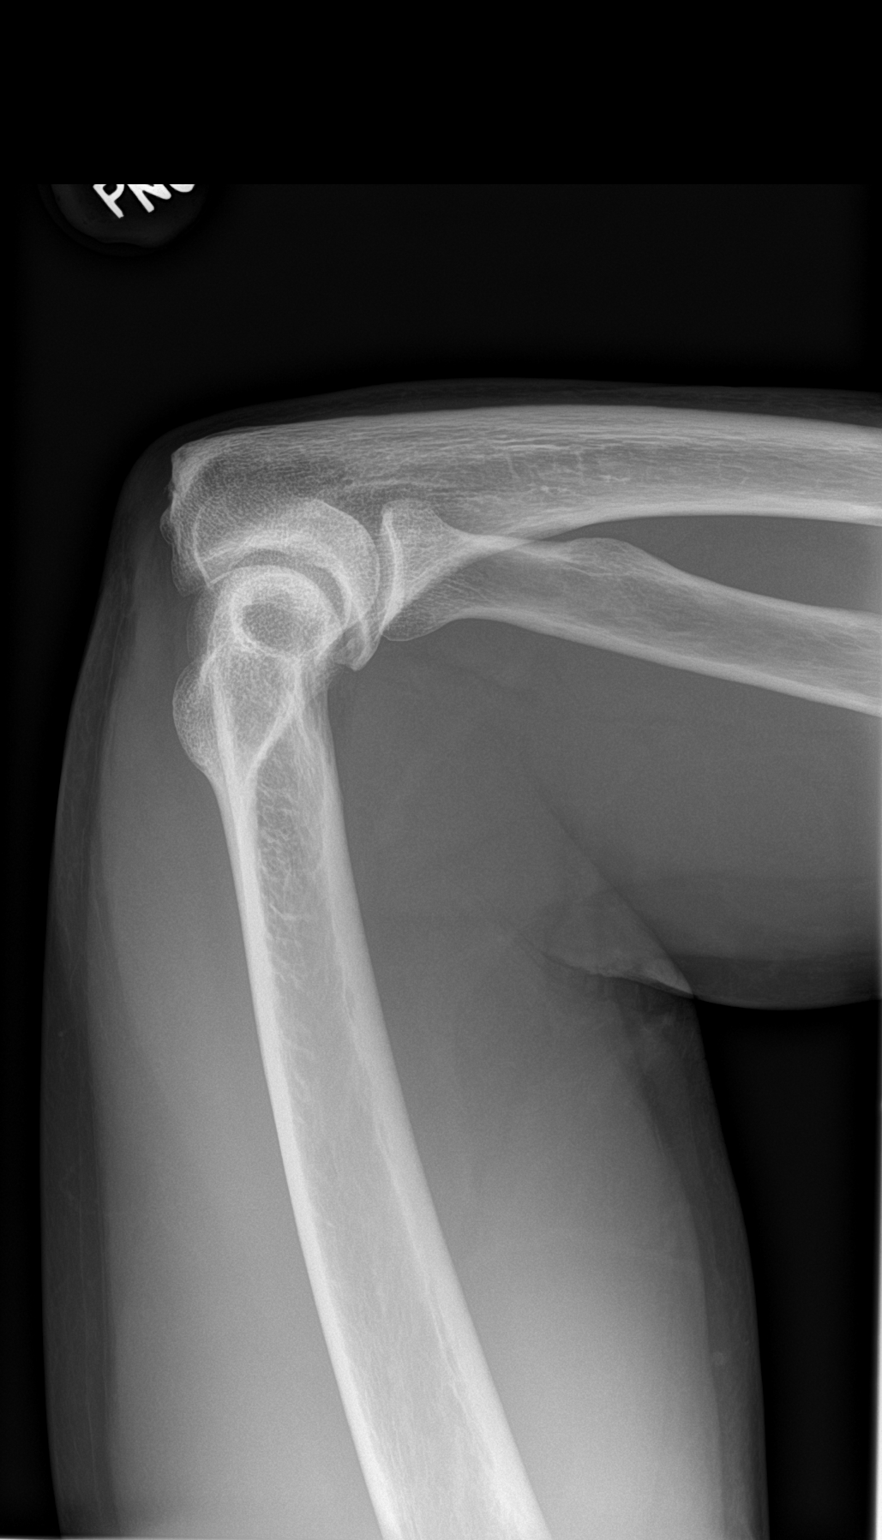

[2 of 2 positions shown; findings below may reference images not displayed]

FINDINGS: There is no evidence of fracture, dislocation, or joint effusion.
There is no evidence of arthropathy or other focal bone abnormality.
Soft tissues are unremarkable.
IMPRESSION: Negative.

## 2021-04-11 ENCOUNTER — Ambulatory Visit (INDEPENDENT_AMBULATORY_CARE_PROVIDER_SITE_OTHER): Payer: 59 | Admitting: Family Medicine

## 2021-04-11 ENCOUNTER — Encounter: Payer: Self-pay | Admitting: Family Medicine

## 2021-04-11 ENCOUNTER — Other Ambulatory Visit: Payer: Self-pay

## 2021-04-11 VITALS — BP 124/70 | HR 62 | Temp 97.6°F | Ht 71.0 in | Wt 174.8 lb

## 2021-04-11 DIAGNOSIS — Z0000 Encounter for general adult medical examination without abnormal findings: Secondary | ICD-10-CM

## 2021-04-11 DIAGNOSIS — Z23 Encounter for immunization: Secondary | ICD-10-CM

## 2021-04-11 DIAGNOSIS — Z0001 Encounter for general adult medical examination with abnormal findings: Secondary | ICD-10-CM

## 2021-04-11 DIAGNOSIS — R1010 Upper abdominal pain, unspecified: Secondary | ICD-10-CM

## 2021-04-11 LAB — CBC WITH DIFFERENTIAL/PLATELET
Basophils Absolute: 0 10*3/uL (ref 0.0–0.1)
Basophils Relative: 0.6 % (ref 0.0–3.0)
Eosinophils Absolute: 0 10*3/uL (ref 0.0–0.7)
Eosinophils Relative: 0.9 % (ref 0.0–5.0)
HCT: 40.9 % (ref 36.0–46.0)
Hemoglobin: 14 g/dL (ref 12.0–15.0)
Lymphocytes Relative: 32.2 % (ref 12.0–46.0)
Lymphs Abs: 1 10*3/uL (ref 0.7–4.0)
MCHC: 34.2 g/dL (ref 30.0–36.0)
MCV: 90.2 fl (ref 78.0–100.0)
Monocytes Absolute: 0.2 10*3/uL (ref 0.1–1.0)
Monocytes Relative: 6.6 % (ref 3.0–12.0)
Neutro Abs: 1.9 10*3/uL (ref 1.4–7.7)
Neutrophils Relative %: 59.7 % (ref 43.0–77.0)
Platelets: 207 10*3/uL (ref 150.0–400.0)
RBC: 4.53 Mil/uL (ref 3.87–5.11)
RDW: 12.1 % (ref 11.5–15.5)
WBC: 3.2 10*3/uL — ABNORMAL LOW (ref 4.0–10.5)

## 2021-04-11 LAB — LIPID PANEL
Cholesterol: 227 mg/dL — ABNORMAL HIGH (ref 0–200)
HDL: 44.9 mg/dL (ref >39.00)
LDL Cholesterol: 161 mg/dL — ABNORMAL HIGH (ref 0–99)
NonHDL: 182.27
Total CHOL/HDL Ratio: 5
Triglycerides: 105 mg/dL (ref 0.0–149.0)
VLDL: 21 mg/dL (ref 0.0–40.0)

## 2021-04-11 LAB — BASIC METABOLIC PANEL
BUN: 21 mg/dL (ref 6–23)
CO2: 25 mEq/L (ref 19–32)
Calcium: 9.3 mg/dL (ref 8.4–10.5)
Chloride: 104 mEq/L (ref 96–112)
Creatinine, Ser: 0.89 mg/dL (ref 0.40–1.20)
GFR: 72.58 mL/min (ref >60.00)
Glucose, Bld: 86 mg/dL (ref 70–99)
Potassium: 4 mEq/L (ref 3.5–5.1)
Sodium: 139 mEq/L (ref 135–145)

## 2021-04-11 LAB — HEPATIC FUNCTION PANEL
ALT: 18 U/L (ref 0–35)
AST: 25 U/L (ref 0–37)
Albumin: 4.5 g/dL (ref 3.5–5.2)
Alkaline Phosphatase: 63 U/L (ref 39–117)
Bilirubin, Direct: 0.1 mg/dL (ref 0.0–0.3)
Total Bilirubin: 0.6 mg/dL (ref 0.2–1.2)
Total Protein: 6.9 g/dL (ref 6.0–8.3)

## 2021-04-11 LAB — TSH: TSH: 3.02 u[IU]/mL (ref 0.35–5.50)

## 2021-04-11 NOTE — Progress Notes (Signed)
Established Patient Office Visit  Subjective:  Patient ID: Kathryn [name], female    DOB: 07-07-1965  Age: 55 y.o. MRN: [id number]  CC:  Chief Complaint  Patient presents with   Annual Exam    HPI Kathryn [name] presents for annual exam-and for additional separate problem as below.  She sees gynecologist regularly.  She had breast cancer previously and bilateral mastectomies.  He gets Pap smears yearly.  She continues to exercise regularly.  She states she and her husband are eating very healthy.  She does have concerns regarding some recent abdominal pains.  She had recent bloating and occasional GERD symptoms.  She has noticed some fullness in the upper abdominal region.  She has had some swelling just below the epigastric region.  Good appetite.  No weight loss.  No stool changes.  No nausea or vomiting.  No relation to eating.  No right upper quadrant pain.  She is concerned partly because of her past history of cancer.   Both of her parents have had history of gastric cancer.  Health maintenance  -Shingles vaccine already given -Previous hepatitis C antibody negative -Needs flu vaccine -No indication for pneumonia vaccine yet -Colonoscopy up-to-date -Pap smears up-to-date  Family history-mother had hypertension and hyperlipidemia.  She had gastric cancer.  Father reportedly diagnosed with gastric cancer in the past year.  She has 2 sisters who are alive and healthy  Social history-retired from police work.  She is married.  She has 1 daughter.  Never smoked.  1 to 2 glasses of wine per week  Past Medical History:  Diagnosis Date   BREAST CANCER, HX OF 10/25/2008   Cancer ([place])    Family history of breast cancer    Family history of breast cancer    Family history of colon cancer    Family history of stomach cancer    HYPERLIPIDEMIA 10/25/2008    Past Surgical History:  Procedure Laterality Date   APPENDECTOMY  2003   CESAREAN SECTION  2004   MASTECTOMY Bilateral 2007  and 2009   TONSILLECTOMY  1974    Family History  Problem Relation Age of Onset   Hyperlipidemia Mother    Hypertension Mother    Crohn's disease Mother    Cancer Mother        gastric cancer   Cancer Father    Deep vein thrombosis Sister    Aneurysm Maternal Aunt 66       brain   Breast cancer Paternal Aunt        dx in her late 37s   Colon cancer Paternal Uncle        dx and died in his 44s   Mental illness Paternal Uncle    Arthritis Other    Hyperlipidemia Other    Hypertension Other     Social History   Socioeconomic History   Marital status: Married    Spouse name: Not on file   Number of children: Not on file   Years of education: Not on file   Highest education level: Not on file  Occupational History   Occupation: Financial risk analyst: [employer]  Tobacco Use   Smoking status: Never   Smokeless tobacco: Never  Vaping Use   Vaping Use: Never used  Substance and Sexual Activity   Alcohol use: Yes    Alcohol/week: 0.0 standard drinks    Comment: 1-2 week   Drug use: No   Sexual activity: Not  on file  Other Topics Concern   Not on file  Social History Narrative   Not on file   Social Determinants of Health   Financial Resource Strain: Not on file  Food Insecurity: Not on file  Transportation Needs: Not on file  Physical Activity: Not on file  Stress: Not on file  Social Connections: Not on file  Intimate Partner Violence: Not on file    Outpatient Medications Prior to Visit  Medication Sig Dispense Refill   azithromycin (ZITHROMAX Z-PAK) 250 MG tablet As directed 6 each 0   BIOTIN PO Take by mouth daily.     CALCIUM PO Take by mouth daily.     cephALEXin (KEFLEX) 500 MG capsule Take 1 capsule (500 mg total) by mouth 4 (four) times daily. 28 capsule 0   Ergocalciferol (VITAMIN D2) 2000 units TABS Take 2,000 Units by mouth.     Multiple Vitamin (MULTIVITAMIN) tablet Take 1 tablet by mouth daily.     Omega-3 Fatty Acids (FISH  OIL PO) Take by mouth every other day.     Probiotic Product (PROBIOTIC-10) CAPS Take by mouth.     No facility-administered medications prior to visit.    No Known Allergies  ROS Review of Systems  Constitutional:  Negative for activity change, appetite change, fatigue, fever and unexpected weight change.  HENT:  Negative for ear pain, hearing loss, sore throat and trouble swallowing.   Eyes:  Negative for visual disturbance.  Respiratory:  Negative for cough and shortness of breath.   Cardiovascular:  Negative for chest pain and palpitations.  Gastrointestinal:  Negative for abdominal pain, blood in stool, constipation and diarrhea.  Endocrine: Negative for polydipsia and polyuria.  Genitourinary:  Negative for dysuria and hematuria.  Musculoskeletal:  Negative for arthralgias, back pain and myalgias.  Skin:  Negative for rash.  Neurological:  Negative for dizziness, syncope and headaches.  Hematological:  Negative for adenopathy.  Psychiatric/Behavioral:  Negative for confusion and dysphoric mood.      Objective:    Physical Exam Constitutional:      Appearance: She is well-developed.  HENT:     Head: Normocephalic and atraumatic.  Eyes:     Pupils: Pupils are equal, round, and reactive to light.  Neck:     Thyroid: No thyromegaly.  Cardiovascular:     Rate and Rhythm: Normal rate and regular rhythm.     Heart sounds: Normal heart sounds. No murmur heard. Pulmonary:     Effort: No respiratory distress.     Breath sounds: Normal breath sounds. No wheezing or rales.  Abdominal:     General: Bowel sounds are normal. There is no distension.     Palpations: Abdomen is soft.     Tenderness: There is no guarding or rebound.     Comments: She has some mild tenderness palpation just below the epigastric area near the midline.  No hepatomegaly or splenomegaly noted.  Musculoskeletal:        General: Normal range of motion.     Cervical back: Normal range of motion and neck  supple.     Right lower leg: No edema.     Left lower leg: No edema.  Lymphadenopathy:     Cervical: No cervical adenopathy.  Skin:    Findings: No rash.  Neurological:     Mental Status: She is alert and oriented to person, place, and time.     Cranial Nerves: No cranial nerve deficit.     Deep Tendon Reflexes:  Reflexes normal.  Psychiatric:        Behavior: Behavior normal.        Thought Content: Thought content normal.        Judgment: Judgment normal.    BP 124/70 (BP Location: Left Arm, Patient Position: Sitting, Cuff Size: Normal)   Pulse 62   Temp 97.6 F (36.4 C) (Oral)   Ht 5\' 11"  (1.803 m)   Wt 174 lb 12.8 oz (79.3 kg)   SpO2 99%   BMI 24.38 kg/m  Wt Readings from Last 3 Encounters:  04/11/21 174 lb 12.8 oz (79.3 kg)  02/18/21 174 lb 11.2 oz (79.2 kg)  11/12/20 178 lb 3.2 oz (80.8 kg)     Health Maintenance Due  Topic Date Due   HIV Screening  Never done   INFLUENZA VACCINE  01/20/2021    There are no preventive care reminders to display for this patient.  Lab Results  Component Value Date   TSH 3.49 04/09/2020   Lab Results  Component Value Date   WBC 3.8 04/09/2020   HGB 14.6 04/09/2020   HCT 41.3 04/09/2020   MCV 92.8 04/09/2020   PLT 257 04/09/2020   Lab Results  Component Value Date   NA 140 04/09/2020   K 4.1 04/09/2020   CO2 28 04/09/2020   GLUCOSE 82 04/09/2020   BUN 17 04/09/2020   CREATININE 0.99 04/09/2020   BILITOT 0.8 04/09/2020   ALKPHOS 66 03/24/2019   AST 24 04/09/2020   ALT 17 04/09/2020   PROT 6.8 04/09/2020   ALBUMIN 4.8 12/05/2019   CALCIUM 9.4 04/09/2020   ANIONGAP 9 05/08/2019   GFR 65.21 03/24/2019   Lab Results  Component Value Date   CHOL 247 (H) 09/20/2020   Lab Results  Component Value Date   HDL 48.90 09/20/2020   Lab Results  Component Value Date   LDLCALC 171 (H) 09/20/2020   Lab Results  Component Value Date   TRIG 140.0 09/20/2020   Lab Results  Component Value Date   CHOLHDL 5  09/20/2020   No results found for: HGBA1C    Assessment & Plan:   #1 patient here for physical exam-and additional separate problem as below.  Flu vaccine given.  She gets Pap smears through GYN.  Colonoscopy up-to-date.  Prior hepatitis C antibody negative.  Shingles vaccine already given.  Will need tetanus booster next year. -Obtain screening labs -Continue regular exercise habits  #2 upper abdominal pain for several weeks now.  She does not have any red flags such as appetite change, weight loss, recurrent vomiting, stool changes.  She is very concerned because of her cancer history. -Obtaining labs as above and set up ultrasound to further assess she is concerned about a fullness to palpation upper abdominal region.  No organomegaly noted.   No orders of the defined types were placed in this encounter.   Follow-up: No follow-ups on file.    Carolann Littler, MD

## 2021-04-11 NOTE — Addendum Note (Signed)
Addended by: Lurlean Leyden on: 04/11/2021 08:43 AM   Modules accepted: Orders

## 2021-04-23 ENCOUNTER — Ambulatory Visit
Admission: RE | Admit: 2021-04-23 | Discharge: 2021-04-23 | Disposition: A | Discharge status: Home / Self Care | Payer: 59 | Source: Ambulatory Visit | Attending: Family Medicine | Admitting: Family Medicine

## 2021-04-23 DIAGNOSIS — R1010 Upper abdominal pain, unspecified: Secondary | ICD-10-CM

## 2021-06-28 IMAGING — NM NM HEPATO W/GB/PHARM/[PERSON_NAME]
2 series · 12 of 12 positions shown · non-contrast
Comparison: 03/13/2020

CLINICAL DATA: Right upper quadrant abdominal pain with nausea and
bloating

EXAM:
NUCLEAR MEDICINE HEPATOBILIARY IMAGING WITH GALLBLADDER EF
TECHNIQUE: Sequential images of the abdomen were obtained [DATE] minutes
following intravenous administration of radiopharmaceutical. After
oral ingestion of Ensure, gallbladder ejection fraction was
determined. At 60 min, normal ejection fraction is greater than 33%.
RADIOPHARMACEUTICALS:  4.85 mCi Kc-33m  Choletec IV

[he hepatobiliary · 4.52mm/px · 6 of 60 frames shown (1 of 2)]
[frame 6/60]
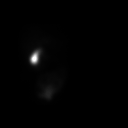
[frame 16/60]
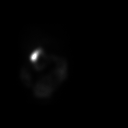
[frame 26/60]
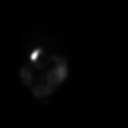
[frame 36/60]
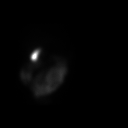
[frame 46/60]
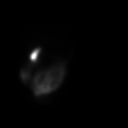
[frame 56/60]
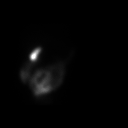

[he hepatobiliary · 4.52mm/px · 6 of 60 frames shown (2 of 2)]
[frame 6/60]
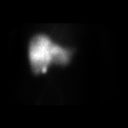
[frame 16/60]
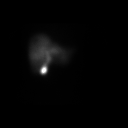
[frame 26/60]
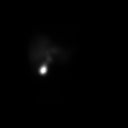
[frame 36/60]
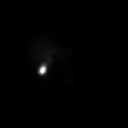
[frame 46/60]
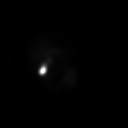
[frame 56/60]
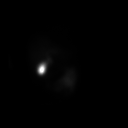

[12 of 12 positions shown; findings below may reference images not displayed]

FINDINGS: Prompt uptake and biliary excretion of activity by the liver is
seen. Gallbladder activity is visualized, consistent with patency of
cystic duct. Biliary activity passes into small bowel, consistent
with patent common bile duct.

Calculated gallbladder ejection fraction is 55%. (Normal gallbladder
ejection fraction with Ensure is greater than 33%.)
IMPRESSION: Normal hepatobiliary scan and gallbladder ejection fraction

## 2022-02-02 ENCOUNTER — Ambulatory Visit: Payer: 59 | Admitting: Family Medicine

## 2022-02-02 VITALS — BP 120/74 | HR 70 | Temp 97.7°F | Ht 71.0 in | Wt 168.4 lb

## 2022-02-02 DIAGNOSIS — M546 Pain in thoracic spine: Secondary | ICD-10-CM | POA: Diagnosis not present

## 2022-02-02 DIAGNOSIS — S86811A Strain of other muscle(s) and tendon(s) at lower leg level, right leg, initial encounter: Secondary | ICD-10-CM

## 2022-02-02 NOTE — Progress Notes (Signed)
Established Patient Office Visit  Subjective   Patient ID: Kathryn [name], female    DOB: 1966-01-12  Age: 56 y.o. MRN: [id number]  Chief Complaint  Patient presents with   Back Pain    Patient complains of back pain, x8 weeks   Spasms    Patient complains of muscle spasms in right calf, x4 days     HPI   Kathryn [name] is seen with back pain thoracic area for about 8 weeks and possibly a little longer.  She had some recent problems with infraspinatus pain and upper back pain.  She is seeing chiropractor and subsequently physical therapist and had some recent dry needling.  She had some improvement in the upper back pain but not the thoracic pain.  Pain is mid thoracic area.  Achy quality.  Somewhat intermittent.  Denies any fevers, chills, night sweats, appetite changes, or significant weight changes.  She has lost a few pounds from last year but attributes this to increased fitness  She is very fitness conscious.  She exercises regularly.  Does have remote history of breast cancer 2007.  No chest pain.  No abdominal pain.  Has not taken any regular medications for this.  She also had some recent right calf pain for a few days.  Possible strain.  No edema in her ankle or leg.  No ecchymosis.  Doing better at this time.  Ambulating without difficulty.  Past Medical History:  Diagnosis Date   BREAST CANCER, HX OF 10/25/2008   Cancer [facility])    Family history of breast cancer    Family history of breast cancer    Family history of colon cancer    Family history of stomach cancer    HYPERLIPIDEMIA 10/25/2008   Past Surgical History:  Procedure Laterality Date   APPENDECTOMY  2003   CESAREAN SECTION  2004   MASTECTOMY Bilateral 2007 and 2009   TONSILLECTOMY  1974    reports that she has never smoked. She has never used smokeless tobacco. She reports current alcohol use. She reports that she does not use drugs. family history includes Aneurysm (age of onset: 73) in her maternal aunt; Arthritis  in an other family member; Breast cancer in her paternal aunt; Cancer in her father and mother; Colon cancer in her paternal uncle; Crohn's disease in her mother; Deep vein thrombosis in her sister; Hyperlipidemia in her mother and another family member; Hypertension in her mother and another family member; Mental illness in her paternal uncle. No Known Allergies  Review of Systems  Constitutional:  Negative for chills, fever, malaise/fatigue and weight loss.  Respiratory:  Negative for cough.   Cardiovascular:  Negative for chest pain.  Gastrointestinal:  Negative for abdominal pain.  Musculoskeletal:  Positive for back pain.      Objective:     BP 120/74 (BP Location: Right Arm, Patient Position: Sitting, Cuff Size: Normal)   Pulse 70   Temp 97.7 F (36.5 C) (Oral)   Ht '5\' 11"'$  (1.803 m)   Wt 168 lb 6.4 oz (76.4 kg)   SpO2 98%   BMI 23.49 kg/m  BP Readings from Last 3 Encounters:  02/02/22 120/74  04/11/21 124/70  02/18/21 110/60   Wt Readings from Last 3 Encounters:  02/02/22 168 lb 6.4 oz (76.4 kg)  04/11/21 174 lb 12.8 oz (79.3 kg)  02/18/21 174 lb 11.2 oz (79.2 kg)      Physical Exam Vitals reviewed.  Constitutional:      General: She  is not in acute distress.    Appearance: Normal appearance. She is not toxic-appearing.  Cardiovascular:     Rate and Rhythm: Normal rate and regular rhythm.  Pulmonary:     Effort: Pulmonary effort is normal.     Breath sounds: Normal breath sounds. No wheezing or rales.  Musculoskeletal:     Comments: Right calf examined.  No edema in the right leg.  Calf nontender.  No pain with dorsiflexion or plantarflexion.  No ecchymosis.  Neurological:     Mental Status: She is alert.      No results found for any visits on 02/02/22.    The 10-year ASCVD risk score (Arnett DK, et al., 2019) is: 2.9%    Assessment & Plan:   #1 recent right calf strain.  No evidence clinically to suggest DVT.  Reassurance given.  #2 over  19-monthhistory thoracic back pain.  Over 2 months duration.  She has had both chiropractic and PT treatment without resolution.  Does have history of breast cancer as above.  Does not have any red flags such as weight loss, appetite change, fever.  Given her history of cancer and  duration over 2 months with failed conservative therapy consider MRI to further evaluate  No follow-ups on file.    BCarolann Littler MD

## 2022-02-02 NOTE — Patient Instructions (Signed)
I will set up MRI thoracic spine.

## 2022-02-08 ENCOUNTER — Ambulatory Visit
Admission: RE | Admit: 2022-02-08 | Discharge: 2022-02-08 | Disposition: A | Discharge status: Home / Self Care | Payer: 59 | Source: Ambulatory Visit | Attending: Family Medicine | Admitting: Family Medicine

## 2022-02-08 DIAGNOSIS — M546 Pain in thoracic spine: Secondary | ICD-10-CM

## 2022-03-11 LAB — HM COLONOSCOPY

## 2022-03-12 ENCOUNTER — Encounter: Payer: Self-pay | Admitting: Family Medicine

## 2022-03-17 ENCOUNTER — Encounter: Payer: Self-pay | Admitting: Family Medicine

## 2022-04-21 ENCOUNTER — Ambulatory Visit (INDEPENDENT_AMBULATORY_CARE_PROVIDER_SITE_OTHER): Payer: 59 | Admitting: Family Medicine

## 2022-04-21 ENCOUNTER — Encounter: Payer: Self-pay | Admitting: Family Medicine

## 2022-04-21 VITALS — BP 110/76 | HR 50 | Temp 97.7°F | Ht 70.47 in | Wt 168.7 lb

## 2022-04-21 DIAGNOSIS — Z0000 Encounter for general adult medical examination without abnormal findings: Secondary | ICD-10-CM

## 2022-04-21 DIAGNOSIS — Z23 Encounter for immunization: Secondary | ICD-10-CM | POA: Diagnosis not present

## 2022-04-21 LAB — HEPATIC FUNCTION PANEL
ALT: 18 U/L (ref 0–35)
AST: 26 U/L (ref 0–37)
Albumin: 4.8 g/dL (ref 3.5–5.2)
Alkaline Phosphatase: 65 U/L (ref 39–117)
Bilirubin, Direct: 0.1 mg/dL (ref 0.0–0.3)
Total Bilirubin: 0.8 mg/dL (ref 0.2–1.2)
Total Protein: 7.4 g/dL (ref 6.0–8.3)

## 2022-04-21 LAB — CBC WITH DIFFERENTIAL/PLATELET
Basophils Absolute: 0 10*3/uL (ref 0.0–0.1)
Basophils Relative: 0.7 % (ref 0.0–3.0)
Eosinophils Absolute: 0 10*3/uL (ref 0.0–0.7)
Eosinophils Relative: 1.3 % (ref 0.0–5.0)
HCT: 41.5 % (ref 36.0–46.0)
Hemoglobin: 14.1 g/dL (ref 12.0–15.0)
Lymphocytes Relative: 36.7 % (ref 12.0–46.0)
Lymphs Abs: 1.2 10*3/uL (ref 0.7–4.0)
MCHC: 34 g/dL (ref 30.0–36.0)
MCV: 91 fl (ref 78.0–100.0)
Monocytes Absolute: 0.2 10*3/uL (ref 0.1–1.0)
Monocytes Relative: 6.7 % (ref 3.0–12.0)
Neutro Abs: 1.8 10*3/uL (ref 1.4–7.7)
Neutrophils Relative %: 54.6 % (ref 43.0–77.0)
Platelets: 231 10*3/uL (ref 150.0–400.0)
RBC: 4.56 Mil/uL (ref 3.87–5.11)
RDW: 12.2 % (ref 11.5–15.5)
WBC: 3.4 10*3/uL — ABNORMAL LOW (ref 4.0–10.5)

## 2022-04-21 LAB — LIPID PANEL
Cholesterol: 239 mg/dL — ABNORMAL HIGH (ref 0–200)
HDL: 50.2 mg/dL (ref >39.00)
LDL Cholesterol: 165 mg/dL — ABNORMAL HIGH (ref 0–99)
NonHDL: 188.84
Total CHOL/HDL Ratio: 5
Triglycerides: 120 mg/dL (ref 0.0–149.0)
VLDL: 24 mg/dL (ref 0.0–40.0)

## 2022-04-21 LAB — BASIC METABOLIC PANEL
BUN: 18 mg/dL (ref 6–23)
CO2: 29 mEq/L (ref 19–32)
Calcium: 9.6 mg/dL (ref 8.4–10.5)
Chloride: 103 mEq/L (ref 96–112)
Creatinine, Ser: 0.88 mg/dL (ref 0.40–1.20)
GFR: 73.05 mL/min (ref >60.00)
Glucose, Bld: 83 mg/dL (ref 70–99)
Potassium: 4 mEq/L (ref 3.5–5.1)
Sodium: 139 mEq/L (ref 135–145)

## 2022-04-21 LAB — TSH: TSH: 2.78 u[IU]/mL (ref 0.35–5.50)

## 2022-04-21 NOTE — Progress Notes (Signed)
Established Patient Office Visit  Subjective   Patient ID: Kathryn [name], female    DOB: 1966/03/14  Age: 56 y.o. MRN: [id number]  Chief Complaint  Patient presents with   Annual Exam    HPI   Kathryn [name] seen for physical exam.  She is generally very healthy.  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  She is very health-conscious in terms of diet and exercise.  She does have history of breast cancer diagnosed back in 2007.  Treated with mastectomy.  She does have history of mild hyperlipidemia but overall low risk for CAD.  Health maintenance reviewed:  -Needs flu vaccine and also tetanus booster -She grades has been completed -Prior hepatitis C screen negative -Colonoscopy was completed 9/23 with recommended 5-year follow-up -Pap up-to-date  Family history-mother had hypertension and hyperlipidemia.  She had gastric cancer.  Father reportedly diagnosed with gastric cancer in the past year.  She has 2 sisters who are alive and healthy   Social history-retired from police work.  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  She is married.  She has 1 daughter.  Never smoked.  1 to 2 glasses of wine per week  Past Medical History:  Diagnosis Date   BREAST CANCER, HX OF 10/25/2008   Cancer [facility])    Family history of breast cancer    Family history of breast cancer    Family history of colon cancer    Family history of stomach cancer    HYPERLIPIDEMIA 10/25/2008   Past Surgical History:  Procedure Laterality Date   APPENDECTOMY  2003   CESAREAN SECTION  2004   MASTECTOMY Bilateral 2007 and 2009   TONSILLECTOMY  1974    reports that she has never smoked. She has never used smokeless tobacco. She reports current alcohol use. She reports that she does not use drugs. family history includes Aneurysm (age of onset: 79) in her maternal aunt; Arthritis in an other family member; Breast cancer in her paternal aunt; Cancer in her father and mother;  Colon cancer in her paternal uncle; Crohn's disease in her mother; Deep vein thrombosis in her sister; Hyperlipidemia in her mother and another family member; Hypertension in her mother and another family member; Mental illness in her paternal uncle. No Known Allergies  Review of Systems  Constitutional:  Negative for chills, fever, malaise/fatigue and weight loss.  HENT:  Negative for hearing loss.   Eyes:  Negative for blurred vision and double vision.  Respiratory:  Negative for cough and shortness of breath.   Cardiovascular:  Negative for chest pain, palpitations and leg swelling.  Gastrointestinal:  Negative for abdominal pain, blood in stool, constipation and diarrhea.  Genitourinary:  Negative for dysuria.  Skin:  Negative for rash.  Neurological:  Negative for dizziness, speech change, seizures, loss of consciousness and headaches.  Psychiatric/Behavioral:  Negative for depression.       Objective:     BP 110/76 (BP Location: Left Arm, Patient Position: Sitting, Cuff Size: Normal)   Pulse (!) 50   Temp 97.7 F (36.5 C) (Oral)   Ht 5' 10.47" (1.79 m)   Wt 168 lb 11.2 oz (76.5 kg)   SpO2 97%   BMI 23.88 kg/m    Physical Exam Vitals reviewed.  Constitutional:      Appearance: She is well-developed.  HENT:     Head: Normocephalic and atraumatic.  Eyes:     Pupils: Pupils are equal, round,  and reactive to light.  Neck:     Thyroid: No thyromegaly.  Cardiovascular:     Rate and Rhythm: Normal rate and regular rhythm.     Heart sounds: Normal heart sounds. No murmur heard. Pulmonary:     Effort: No respiratory distress.     Breath sounds: Normal breath sounds. No wheezing or rales.  Abdominal:     General: Bowel sounds are normal. There is no distension.     Palpations: Abdomen is soft. There is no mass.     Tenderness: There is no abdominal tenderness. There is no guarding or rebound.  Musculoskeletal:        General: Normal range of motion.     Cervical back:  Normal range of motion and neck supple.     Right lower leg: No edema.     Left lower leg: No edema.  Lymphadenopathy:     Cervical: No cervical adenopathy.  Skin:    Findings: No rash.  Neurological:     Mental Status: She is alert and oriented to person, place, and time.     Cranial Nerves: No cranial nerve deficit.  Psychiatric:        Behavior: Behavior normal.        Thought Content: Thought content normal.        Judgment: Judgment normal.      No results found for any visits on 04/21/22.    The 10-year ASCVD risk score (Arnett DK, et al., 2019) is: 2.4%    Assessment & Plan:   Problem List Items Addressed This Visit   None Visit Diagnoses     Physical exam    -  Primary   Relevant Orders   Basic metabolic panel   Lipid panel   CBC with Differential/Platelet   TSH   Hepatic function panel   Need for influenza vaccination       Relevant Orders   Flu Vaccine QUAD 6+ mos PF IM (Fluarix Quad PF) (Completed)   Need for Tdap vaccination       Relevant Orders   Tdap vaccine greater than or equal to 7yo IM (Completed)     -Vaccines given as above including flu vaccine and Tdap -Continue regular exercise habits -Continue low saturated fat diet -She plans to continue GYN follow-up for Pap smears.  Has had bilateral mastectomies. -We did discuss coronary calcium score which she will consider.  Handout given.  She is concerned because of her hyperlipidemia but her 10-year risk for CAD only calculates to about 2.4%.  No follow-ups on file.    Carolann Littler, MD

## 2022-10-05 ENCOUNTER — Encounter: Payer: Self-pay | Admitting: Family Medicine

## 2022-10-05 ENCOUNTER — Ambulatory Visit: Payer: 59 | Admitting: Family Medicine

## 2022-10-05 VITALS — BP 102/76 | HR 62 | Temp 97.9°F | Wt 165.6 lb

## 2022-10-05 DIAGNOSIS — W57XXXA Bitten or stung by nonvenomous insect and other nonvenomous arthropods, initial encounter: Secondary | ICD-10-CM

## 2022-10-05 DIAGNOSIS — S30860A Insect bite (nonvenomous) of lower back and pelvis, initial encounter: Secondary | ICD-10-CM

## 2022-10-05 MED ORDER — DOXYCYCLINE HYCLATE 100 MG PO CAPS: 100.0000 mg | ORAL_CAPSULE | Freq: Two times a day (BID) | ORAL | 0 refills | Status: AC

## 2022-10-05 NOTE — Progress Notes (Signed)
   Subjective:    Patient ID: [name], female    DOB: 1966/01/27, 57 y.o.   MRN: [id number]  HPI Here for 2 apparent tick bites on her back. These were discovered last weekend when the areas began to itch. Her husband pulled one tick out that was embedded, but there are 2 areas of redness that itch. She denies any fever or joint pains.    Review of Systems  Constitutional: Negative.   Respiratory: Negative.    Cardiovascular: Negative.   Musculoskeletal: Negative.   Skin:  Positive for color change.  Neurological: Negative.        Objective:   Physical Exam Constitutional:      Appearance: Normal appearance.  Cardiovascular:     Rate and Rhythm: Normal rate and regular rhythm.     Pulses: Normal pulses.     Heart sounds: Normal heart sounds.  Pulmonary:     Effort: Pulmonary effort is normal.     Breath sounds: Normal breath sounds.  Skin:    Comments: There are 2 small areas of erythema that are slightly raised on the back, each less than 1 cm. One is in the center of the middle back and the other is in the center of the lower back   Neurological:     Mental Status: She is alert.           Assessment & Plan:  Tick bites. We will cover these with 10 days of Doxycycline. Recheck as needed.  Gershon Crane, MD

## 2023-03-04 ENCOUNTER — Other Ambulatory Visit: Payer: Self-pay

## 2023-03-04 ENCOUNTER — Ambulatory Visit: Payer: 59 | Admitting: Family Medicine

## 2023-03-04 ENCOUNTER — Ambulatory Visit (INDEPENDENT_AMBULATORY_CARE_PROVIDER_SITE_OTHER): Payer: 59

## 2023-03-04 ENCOUNTER — Encounter: Payer: Self-pay | Admitting: Family Medicine

## 2023-03-04 VITALS — BP 122/82 | HR 62 | Ht 70.5 in | Wt 164.0 lb

## 2023-03-04 DIAGNOSIS — S76311A Strain of muscle, fascia and tendon of the posterior muscle group at thigh level, right thigh, initial encounter: Secondary | ICD-10-CM | POA: Diagnosis not present

## 2023-03-04 DIAGNOSIS — M25561 Pain in right knee: Secondary | ICD-10-CM | POA: Diagnosis not present

## 2023-03-04 DIAGNOSIS — G8929 Other chronic pain: Secondary | ICD-10-CM

## 2023-03-04 NOTE — Progress Notes (Signed)
I, Stevenson Clinch, CMA acting as a scribe for Clementeen Graham, MD.  Kathryn [name] is a 57 y.o. female who presents to [facility] today for R leg pain x 3 weeks. MOI: none. Pt locates pain to. The knee will feel unstable at times. Burning sensation in the hamstring. Locates knee sx to peripatellar region.   Low back pain: LBP Radiates: hamstring to knee Aggravates: sx are intermittent Treatments tried: ice, heat, BioFreeze, Epsom salt soaks, IBU  Pertinent review of systems: No fevers or chills  Relevant historical information: Carpal tunnel syndrome Is a Systems analyst. History of breast cancer.  Exam:  BP 122/82   Pulse 62   Ht 5' 10.5" (1.791 m)   Wt 164 lb (74.4 kg)   SpO2 97%   BMI 23.20 kg/m  General: Well Developed, well nourished, and in no acute distress.   MSK: Right knee normal-appearing Normal motion. Mildly tender palpation medial joint line. No laxity. Intact strength.  Right hamstring normal appearing Normal motion. Tender palpation muscle belly the distal medial hamstring. Minimal pain with resisted hamstring knee flexion.  L-spine nontender to palpation normal lumbar motion.  Negative straight leg raise test.   Lab and Radiology Results  Diagn[facility] MSK Ultrasound of: Right knee and hamstring Right knee: Quad tendon intact.  Calcific change present at distal quadriceps tendon insertion site.. Mild joint effusion is present. Patellar tendon is normal-appearing My degeneration of the medial and lateral joint lines. No Baker's cyst. Distal medial hamstring area of pain normal.  Ultrasound examination. Impression: Joint effusion and mild DJD.  X-ray images right knee obtained today personally and independently interpreted Calcific change at superior patellar pole distal quad tendon insertion represents chronic quadriceps tendinitis. Moderate patellofemoral DJD. Minimal medial DJD.  No acute fractures are  visible. Await formal radiology review   Assessment and Plan: 57 y.o. female with right knee pain and stiffness thought to be primarily due to the joint effusion which is probably due to the patellofemoral DJD seen on x-ray.  She does have some vague burning discomfort in her posterior hamstring which is thought to be hamstring tendinitis.  There is a possibility this is lumbar radiculopathy.    Plan for compression sleeve and Askling L protocol  Additionally plan for home exercise program as above.  If not improving consider formal physical therapy. Could do a steroid injection whenever it makes sense.  For now I think it makes sense to hold off on steroid injections.  PDMP not reviewed this encounter. Orders Placed This Encounter  Procedures   [place] LIMITED JOINT SPACE STRUCTURES LOW RIGHT(NO LINKED CHARGES)    Order Specific Question:   Reason for Exam (SYMPTOM  OR DIAGNOSIS REQUIRED)    Answer:   right LE pain    Order Specific Question:   Preferred imaging location?    Answer:   [facility] Knee AP/LAT W/Sunrise Right    Standing Status:   Future    Number of Occurrences:   1    Standing Expiration Date:   04/03/2023    Order Specific Question:   Reason for Exam (SYMPTOM  OR DIAGNOSIS REQUIRED)    Answer:   right knee pain    Order Specific Question:   Preferred imaging location?    Answer:   [facility]    Order Specific Question:   Is patient pregnant?    Answer:   No   No orders of  the defined types were placed in this encounter.    Discussed warning signs or symptoms. Please see discharge instructions. Patient expresses understanding.   The above documentation has been reviewed and is accurate and complete Clementeen Graham, M.D.

## 2023-03-04 NOTE — Patient Instructions (Addendum)
Thank you for coming in today.   I recommend you obtained a compression sleeve to help with your joint problems. There are many options on the market however I recommend obtaining a Full Knee Body Helix compression sleeve.  You can find information (including how to appropriate measure yourself for sizing) can be found at [email or web address] [email or web address].ch.  Many of these products are health savings account (HSA) eligible.  You can use the compression sleeve at any time throughout the day but is most important to use while being active as well as for 2 hours post-activity.   It is appropriate to ice following activity with the compression sleeve in place.   Askling Hamstring Protocol  Please get an Xray today before you leave   Let me know how it goes

## 2023-03-10 ENCOUNTER — Encounter: Payer: Self-pay | Admitting: Podiatry

## 2023-03-10 ENCOUNTER — Ambulatory Visit: Payer: 59 | Admitting: Podiatry

## 2023-03-10 DIAGNOSIS — B070 Plantar wart: Secondary | ICD-10-CM

## 2023-03-10 MED ORDER — FLUOROURACIL 5 % EX CREA: TOPICAL_CREAM | Freq: Two times a day (BID) | CUTANEOUS | 2 refills | Status: AC

## 2023-03-10 NOTE — Progress Notes (Signed)
Subjective:   Patient ID: Kathryn [name], female   DOB: 57 y.o.   MRN: [id number]   HPI Patient states that she has some kind of lesion plantar left hard to describe it can be bothersome at times and at times being itchy.  She has tried over-the-counter medications on it and patient does not smoke likes to be active   Review of Systems  All other systems reviewed and are negative.       Objective:  Physical Exam Vitals and nursing note reviewed.  Constitutional:      Appearance: She is well-developed.  Pulmonary:     Effort: Pulmonary effort is normal.  Musculoskeletal:        General: Normal range of motion.  Skin:    General: Skin is warm.  Neurological:     Mental Status: She is alert.     Neurovascular status intact muscle strength found to be adequate range of motion adequate with patient noted to have lesion plantar aspect left heel that measures about 1 cm x 1 cm and shows pinpoint bleeding upon debridement     Assessment:  Probability for verruca plantaris plantar left     Plan:  H&P reviewed condition debrided lesion applied chemical agent to create immune response with sterile dressing and wrote prescription for Efudex for home usage if symptoms persist may consider stronger medication but do not want too strong for medicine she is getting ready to go to [facility] for vacation in the next several days.  All questions answered

## 2023-03-11 NOTE — H&P (Signed)
Subjective:      Follow-up   Here for follow up discussion prior to planned removal breast implants.   History right stage I IDC in 2007, ER/PR+, Her2-. Underwent mastectomy with immediate reconstruction with Dr. Shelle Iron, adjuvant chemotherapy. Underwent prophylactic mastectomy 2010 left with implant based reconstruction by Dr. Odis Luster. Per his last notes 2015 patient has 400 cc moderate plus profile silicone gel implants. Has had known inferior displacement left implant since at least 2015 and discussed capsulorraphy with Dr. Odis Luster. Has in past reported some concerns about having silicone in her body and whether she should remove.   No records available, no implant card available. Reports had TE placed bilateral at time of mastectomies with staged implant placement. Right NAC reconstruction with flap and tattoo. Does not recall any use of ADM,   MRI chest completed 3.3.20 to evaluate implant integrity, no evidence rupture.   Both parents have had diagnosis of gastric cancer. Patient declined genetic testing 2017.   Retired. 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 Daughter is gymnast.   Review of Systems     Objective:  Physical Exam    CV: normal heart sounds normal rhythm Pulm: clear to auscultation   Chest: bilateral implant reconstruction, soft bilateral, transverse chest scars, right nipple reconstruction with tattoo areola- this is located over lower pole implant No masses, no visible rippling, left implant 1-2 cm lower on chest than right no significant lateral displacement in supine position +bilateral animation Axillae benign   Assessment:    History right breast cancer, adjuvant chemotherapy S/p bilateral mastectomies with subpectoral implant based reconstruction   Plan:    Plan removal bilateral breast implants, possible capsulectomies.    Continues to have concerns about rupture and overall not satisfied with reconstruction.  Reviewed removal implants, at least partial anterior capsulectomy to aid with adherence muscle to chest. Reviewed elliptical skin excision including reconstructed nipple. Reviewed OP surgery, drains. Counseled if any rupture implant noted intraoperatively will try to remove as much capsule as possible to aid with removal of silicone but cannot guarantee her all silicone will be removed. Reviewed en bloc resection is a cancer operation and not indicated in this situation. Previously provided ASAPS and breast consortium consensus statements regarding capsulectomy. Reviewed muscle will be sutured back to chest wall. Reviewed post operative limitations including not working out/not acting as Psychologist, educational for 4-6 w post op.    Additional risks including but not limited to bleeding, hematoma, seroma. Infection, asymmetry, damage to adjacent structures,need for additional procedures, unacceptable cosmetic result, blood clots in legs or lungs reviewed. Completed ASPS consent for breast implant removal, surgical treatment capsule.   Drain teaching completed. Rx for tramadol and Robaxin given.  Glenna Fellows, MD [facility]  Office/ physician access line after hours [phone]

## 2023-03-19 ENCOUNTER — Encounter (HOSPITAL_COMMUNITY): Payer: Self-pay

## 2023-03-19 NOTE — Progress Notes (Signed)
Surgical Instructions   Your procedure is scheduled on March 26, 2023. Report to [facility] Main Entrance "A" at 05:30 A.M., then check in with the Admitting office. Any questions or running late day of surgery: call [phone]  Questions prior to your surgery date: call [phone], Monday-Friday, 8am-4pm. If you experience any cold or flu symptoms such as cough, fever, chills, shortness of breath, etc. between now and your scheduled surgery, please notify us at the above number.     Remember:  Do not eat after midnight the night before your surgery   You may drink clear liquids until 04:30 the morning of your surgery.   Clear liquids allowed are: Water, Non-Citrus Juices (without pulp), Carbonated Beverages, Clear Tea, Black Coffee Only (NO MILK, CREAM OR POWDERED CREAMER of any kind), and Gatorade.    Take these medicines the morning of surgery with A SIP OF WATER   Probiotic Product (PROBIOTIC-10)    May take these medicines IF NEEDED:  methocarbamol (ROBAXIN)  traMADol (ULTRAM)    One week prior to surgery, STOP taking any Aspirin (unless otherwise instructed by your surgeon) Aleve, Naproxen, Ibuprofen, Motrin, Advil, Goody's, BC's, all herbal medications, fish oil, and non-prescription vitamins.                     Do NOT Smoke (Tobacco/Vaping) for 24 hours prior to your procedure.  If you use a CPAP at night, you may bring your mask/headgear for your overnight stay.   You will be asked to remove any contacts, glasses, piercing's, hearing aid's, dentures/partials prior to surgery. Please bring cases for these items if needed.    Patients discharged the day of surgery will not be allowed to drive home, and someone needs to stay with them for 24 hours.  SURGICAL WAITING ROOM VISITATION Patients may have no more than 2 support people in the waiting area - these visitors may rotate.   Pre-op nurse will coordinate an appropriate time for 1 ADULT support person, who may  not rotate, to accompany patient in pre-op.  Children under the age of 68 must have an adult with them who is not the patient and must remain in the main waiting area with an adult.  If the patient needs to stay at the hospital during part of their recovery, the visitor guidelines for inpatient rooms apply.  Please refer to the [facility] website for the visitor guidelines for any additional information.   If you received a COVID test during your pre-op visit  it is requested that you wear a mask when out in public, stay away from anyone that may not be feeling well and notify your surgeon if you develop symptoms. If you have been in contact with anyone that has tested positive in the last 10 days please notify you surgeon.      Pre-operative CHG Bathing Instructions   You can play a key role in reducing the risk of infection after surgery. Your skin needs to be as free of germs as possible. You can reduce the number of germs on your skin by washing with CHG (chlorhexidine gluconate) soap before surgery. CHG is an antiseptic soap that kills germs and continues to kill germs even after washing.   DO NOT use if you have an allergy to chlorhexidine/CHG or antibacterial soaps. If your skin becomes reddened or irritated, stop using the CHG and notify one of our RNs at [phone].  TAKE A SHOWER THE NIGHT BEFORE SURGERY AND THE DAY OF SURGERY    Please keep in mind the following:  DO NOT shave, including legs and underarms, 48 hours prior to surgery.   You may shave your face before/day of surgery.  Place clean sheets on your bed the night before surgery Use a clean washcloth (not used since being washed) for each shower. DO NOT sleep with pet's night before surgery.  CHG Shower Instructions:  Wash your face and private area with normal soap. If you choose to wash your hair, wash first with your normal shampoo.  After you use shampoo/soap, rinse your hair and body thoroughly to  remove shampoo/soap residue.  Turn the water OFF and apply half the bottle of CHG soap to a CLEAN washcloth.  Apply CHG soap ONLY FROM YOUR NECK DOWN TO YOUR TOES (washing for 3-5 minutes)  DO NOT use CHG soap on face, private areas, open wounds, or sores.  Pay special attention to the area where your surgery is being performed.  If you are having back surgery, having someone wash your back for you may be helpful. Wait 2 minutes after CHG soap is applied, then you may rinse off the CHG soap.  Pat dry with a clean towel  Put on clean pajamas    Additional instructions for the day of surgery: DO NOT APPLY any lotions, deodorants, cologne, or perfumes.   Do not wear jewelry or makeup Do not wear nail polish, gel polish, artificial nails, or any other type of covering on natural nails (fingers and toes) Do not bring valuables to the hospital. [facility] is not responsible for valuables/personal belongings. Put on clean/comfortable clothes.  Please brush your teeth.  Ask your nurse before applying any prescription medications to the skin.

## 2023-03-22 ENCOUNTER — Encounter (HOSPITAL_COMMUNITY): Payer: Self-pay

## 2023-03-22 ENCOUNTER — Encounter (HOSPITAL_COMMUNITY)
Admission: RE | Admit: 2023-03-22 | Discharge: 2023-03-22 | Disposition: A | Discharge status: Home / Self Care | Payer: 59 | Source: Ambulatory Visit | Attending: Plastic Surgery | Admitting: Plastic Surgery

## 2023-03-22 ENCOUNTER — Other Ambulatory Visit: Payer: Self-pay

## 2023-03-22 VITALS — BP 129/82 | HR 66 | Temp 98.4°F | Resp 17 | Ht 71.0 in | Wt 166.7 lb

## 2023-03-22 DIAGNOSIS — Z01818 Encounter for other preprocedural examination: Secondary | ICD-10-CM | POA: Diagnosis present

## 2023-03-22 LAB — CBC
HCT: 41.7 % (ref 36.0–46.0)
Hemoglobin: 14.1 g/dL (ref 12.0–15.0)
MCH: 30.2 pg (ref 26.0–34.0)
MCHC: 33.8 g/dL (ref 30.0–36.0)
MCV: 89.3 fL (ref 80.0–100.0)
Platelets: 238 10*3/uL (ref 150–400)
RBC: 4.67 MIL/uL (ref 3.87–5.11)
RDW: 11.6 % (ref 11.5–15.5)
WBC: 7 10*3/uL (ref 4.0–10.5)
nRBC: 0 % (ref 0.0–0.2)

## 2023-03-22 NOTE — Progress Notes (Signed)
PCP - Dr. Evelena Peat Cardiologist - DENIES  PPM/ICD - DENIES Device Orders - N/A Rep Notified - N/A  Chest x-ray - N/A EKG - 9-13-*24 Stress Test - DENIES ECHO - 05-04-13 Cardiac Cath - DENIES  Sleep Study - DENIES CPAP - N/A  Fasting Blood Sugar - DENIES - NO DM Checks Blood Sugar _____ times a day: N/A  Last dose of GLP1 agonist-  N/A GLP1 instructions: N/A  Blood Thinner Instructions: N/A Aspirin Instructions: Y  ERAS Protcol - Y PRE-SURGERY Ensure or G2- NO DRINK  COVID TEST- N/A   Anesthesia review: N/A  Patient denies shortness of breath, fever, cough and chest pain at PAT appointment. Pt refuses any respiratory illnesses or issues at this time.    All instructions explained to the patient, with a verbal understanding of the material. Patient agrees to go over the instructions while at home for a better understanding. Patient also instructed to self quarantine after being tested for COVID-19. The opportunity to ask questions was provided.

## 2023-03-22 NOTE — Progress Notes (Signed)
Right knee x-ray shows arthritis especially underneath the kneecap.

## 2023-03-25 NOTE — Anesthesia Preprocedure Evaluation (Addendum)
Anesthesia Evaluation  Patient identified by MRN, date of birth, ID band Patient awake    Reviewed: Allergy & Precautions, NPO status , Patient's Chart, lab work & pertinent test results  Airway Mallampati: II  TM Distance: >3 FB Neck ROM: Full    Dental no notable dental hx. (+) Dental Advisory Given, Teeth Intact   Pulmonary neg pulmonary ROS   Pulmonary exam normal breath sounds clear to auscultation       Cardiovascular negative cardio ROS Normal cardiovascular exam Rhythm:Regular Rate:Normal     Neuro/Psych negative neurological ROS     GI/Hepatic negative GI ROS, Neg liver ROS,,,  Endo/Other  negative endocrine ROS    Renal/GU negative Renal ROS     Musculoskeletal negative musculoskeletal ROS (+)    Abdominal   Peds  Hematology negative hematology ROS (+)   Anesthesia Other Findings   Reproductive/Obstetrics                             Anesthesia Physical Anesthesia Plan  ASA: 2  Anesthesia Plan: General   Post-op Pain Management: Tylenol PO (pre-op)* and Gabapentin PO (pre-op)*   Induction: Intravenous  PONV Risk Score and Plan: 4 or greater and Ondansetron, Dexamethasone, Midazolam and Treatment may vary due to age or medical condition  Airway Management Planned: Oral ETT and LMA  Additional Equipment:   Intra-op Plan:   Post-operative Plan: Extubation in OR  Informed Consent: I have reviewed the patients History and Physical, chart, labs and discussed the procedure including the risks, benefits and alternatives for the proposed anesthesia with the patient or authorized representative who has indicated his/her understanding and acceptance.     Dental advisory given  Plan Discussed with: CRNA  Anesthesia Plan Comments:        Anesthesia Quick Evaluation

## 2023-03-26 ENCOUNTER — Encounter (HOSPITAL_COMMUNITY): Payer: Self-pay | Admitting: Plastic Surgery

## 2023-03-26 ENCOUNTER — Ambulatory Visit (HOSPITAL_COMMUNITY)
Admission: RE | Admit: 2023-03-26 | Discharge: 2023-03-26 | Disposition: A | Discharge status: Home / Self Care | Payer: 59 | Attending: Plastic Surgery | Admitting: Plastic Surgery

## 2023-03-26 ENCOUNTER — Ambulatory Visit (HOSPITAL_BASED_OUTPATIENT_CLINIC_OR_DEPARTMENT_OTHER): Payer: 59 | Admitting: Anesthesiology

## 2023-03-26 ENCOUNTER — Other Ambulatory Visit: Payer: Self-pay

## 2023-03-26 ENCOUNTER — Encounter (HOSPITAL_COMMUNITY)
Admission: RE | Disposition: A | Discharge status: Home / Self Care | Payer: Self-pay | Source: Home / Self Care | Attending: Plastic Surgery

## 2023-03-26 ENCOUNTER — Ambulatory Visit (HOSPITAL_COMMUNITY): Payer: 59 | Admitting: Anesthesiology

## 2023-03-26 DIAGNOSIS — Z45812 Encounter for adjustment or removal of left breast implant: Secondary | ICD-10-CM | POA: Insufficient documentation

## 2023-03-26 DIAGNOSIS — Z9221 Personal history of antineoplastic chemotherapy: Secondary | ICD-10-CM | POA: Diagnosis not present

## 2023-03-26 DIAGNOSIS — Z45811 Encounter for adjustment or removal of right breast implant: Secondary | ICD-10-CM | POA: Insufficient documentation

## 2023-03-26 DIAGNOSIS — Z9013 Acquired absence of bilateral breasts and nipples: Secondary | ICD-10-CM | POA: Insufficient documentation

## 2023-03-26 DIAGNOSIS — Z803 Family history of malignant neoplasm of breast: Secondary | ICD-10-CM | POA: Diagnosis not present

## 2023-03-26 DIAGNOSIS — Z421 Encounter for breast reconstruction following mastectomy: Secondary | ICD-10-CM

## 2023-03-26 DIAGNOSIS — Z853 Personal history of malignant neoplasm of breast: Secondary | ICD-10-CM | POA: Diagnosis not present

## 2023-03-26 DIAGNOSIS — Z01818 Encounter for other preprocedural examination: Secondary | ICD-10-CM

## 2023-03-26 HISTORY — PX: CAPSULECTOMY: SHX5381

## 2023-03-26 HISTORY — PX: BREAST IMPLANT REMOVAL: SHX5361

## 2023-03-26 LAB — BASIC METABOLIC PANEL
Anion gap: 12 (ref 5–15)
BUN: 18 mg/dL (ref 6–20)
CO2: 24 mmol/L (ref 22–32)
Calcium: 8.8 mg/dL — ABNORMAL LOW (ref 8.9–10.3)
Chloride: 105 mmol/L (ref 98–111)
Creatinine, Ser: 0.87 mg/dL (ref 0.44–1.00)
GFR, Estimated: >60 mL/min (ref >60)
Glucose, Bld: 94 mg/dL (ref 70–99)
Potassium: 3.9 mmol/L (ref 3.5–5.1)
Sodium: 141 mmol/L (ref 135–145)

## 2023-03-26 SURGERY — REMOVAL, IMPLANT, BREAST
Anesthesia: General | Site: Breast | Laterality: Left

## 2023-03-26 MED ORDER — MEPERIDINE HCL 25 MG/ML IJ SOLN: 6.2500 mg | INTRAMUSCULAR | Status: DC | PRN

## 2023-03-26 MED ORDER — MIDAZOLAM HCL 5 MG/5ML IJ SOLN
INTRAMUSCULAR | Status: DC | PRN
  Administered 2023-03-26: 2 mg via INTRAVENOUS

## 2023-03-26 MED ORDER — ORAL CARE MOUTH RINSE: 15.0000 mL | Freq: Once | OROMUCOSAL | Status: AC

## 2023-03-26 MED ORDER — PROPOFOL 10 MG/ML IV BOLUS
INTRAVENOUS | Status: DC | PRN
  Administered 2023-03-26: 150 mg via INTRAVENOUS

## 2023-03-26 MED ORDER — CHLORHEXIDINE GLUCONATE CLOTH 2 % EX PADS: 6.0000 | MEDICATED_PAD | Freq: Once | CUTANEOUS | Status: DC

## 2023-03-26 MED ORDER — BUPIVACAINE-EPINEPHRINE (PF) 0.25% -1:200000 IJ SOLN
INTRAMUSCULAR | Status: AC
  Filled 2023-03-26: qty 30

## 2023-03-26 MED ORDER — DROPERIDOL 2.5 MG/ML IJ SOLN: 0.6250 mg | Freq: Once | INTRAMUSCULAR | Status: DC | PRN

## 2023-03-26 MED ORDER — CHLORHEXIDINE GLUCONATE 0.12 % MT SOLN
15.0000 mL | Freq: Once | OROMUCOSAL | Status: AC
  Administered 2023-03-26: 15 mL via OROMUCOSAL
  Filled 2023-03-26: qty 15

## 2023-03-26 MED ORDER — FENTANYL CITRATE (PF) 250 MCG/5ML IJ SOLN
INTRAMUSCULAR | Status: AC
  Filled 2023-03-26: qty 5

## 2023-03-26 MED ORDER — FENTANYL CITRATE (PF) 100 MCG/2ML IJ SOLN
INTRAMUSCULAR | Status: DC | PRN
  Administered 2023-03-26 (×2): 50 ug via INTRAVENOUS

## 2023-03-26 MED ORDER — BUPIVACAINE HCL (PF) 0.25 % IJ SOLN
INTRAMUSCULAR | Status: AC
  Filled 2023-03-26: qty 30

## 2023-03-26 MED ORDER — MIDAZOLAM HCL 2 MG/2ML IJ SOLN
INTRAMUSCULAR | Status: AC
  Filled 2023-03-26: qty 2

## 2023-03-26 MED ORDER — ROCURONIUM BROMIDE 10 MG/ML (PF) SYRINGE
PREFILLED_SYRINGE | INTRAVENOUS | Status: AC
  Filled 2023-03-26: qty 10

## 2023-03-26 MED ORDER — DEXAMETHASONE SODIUM PHOSPHATE 4 MG/ML IJ SOLN
INTRAMUSCULAR | Status: DC | PRN
  Administered 2023-03-26: 10 mg via INTRAVENOUS

## 2023-03-26 MED ORDER — EPHEDRINE SULFATE-NACL 50-0.9 MG/10ML-% IV SOSY
PREFILLED_SYRINGE | INTRAVENOUS | Status: DC | PRN
Start: 2023-03-26 — End: 2023-03-26
  Administered 2023-03-26 (×2): 5 mg via INTRAVENOUS

## 2023-03-26 MED ORDER — SUGAMMADEX SODIUM 200 MG/2ML IV SOLN
INTRAVENOUS | Status: DC | PRN
Start: 2023-03-26 — End: 2023-03-26
  Administered 2023-03-26: 200 mg via INTRAVENOUS

## 2023-03-26 MED ORDER — PROPOFOL 10 MG/ML IV BOLUS
INTRAVENOUS | Status: AC
  Filled 2023-03-26: qty 20

## 2023-03-26 MED ORDER — LACTATED RINGERS IV SOLN: INTRAVENOUS | Status: DC

## 2023-03-26 MED ORDER — BUPIVACAINE HCL 0.25 % IJ SOLN
INTRAMUSCULAR | Status: DC | PRN
Start: 2023-03-26 — End: 2023-03-26
  Administered 2023-03-26 (×2): 15 mL

## 2023-03-26 MED ORDER — ONDANSETRON HCL 4 MG/2ML IJ SOLN
INTRAMUSCULAR | Status: DC | PRN
  Administered 2023-03-26: 4 mg via INTRAVENOUS

## 2023-03-26 MED ORDER — HYDROMORPHONE HCL 1 MG/ML IJ SOLN: 0.2500 mg | INTRAMUSCULAR | Status: DC | PRN

## 2023-03-26 MED ORDER — DEXAMETHASONE SODIUM PHOSPHATE 10 MG/ML IJ SOLN
INTRAMUSCULAR | Status: AC
  Filled 2023-03-26: qty 1

## 2023-03-26 MED ORDER — OXYCODONE HCL 5 MG PO TABS
5.0000 mg | ORAL_TABLET | Freq: Once | ORAL | Status: AC | PRN
  Administered 2023-03-26: 5 mg via ORAL

## 2023-03-26 MED ORDER — GABAPENTIN 300 MG PO CAPS
300.0000 mg | ORAL_CAPSULE | Freq: Once | ORAL | Status: AC
  Administered 2023-03-26: 300 mg via ORAL
  Filled 2023-03-26: qty 1

## 2023-03-26 MED ORDER — 0.9 % SODIUM CHLORIDE (POUR BTL) OPTIME
TOPICAL | Status: DC | PRN
  Administered 2023-03-26: 1000 mL

## 2023-03-26 MED ORDER — ACETAMINOPHEN 500 MG PO TABS
1000.0000 mg | ORAL_TABLET | Freq: Once | ORAL | Status: AC
  Administered 2023-03-26: 1000 mg via ORAL
  Filled 2023-03-26: qty 2

## 2023-03-26 MED ORDER — SODIUM CHLORIDE 0.9 % IV SOLN
Freq: Once | INTRAVENOUS | Status: DC
  Filled 2023-03-26: qty 10

## 2023-03-26 MED ORDER — ONDANSETRON HCL 4 MG/2ML IJ SOLN
INTRAMUSCULAR | Status: AC
  Filled 2023-03-26: qty 2

## 2023-03-26 MED ORDER — CEFAZOLIN SODIUM-DEXTROSE 2-4 GM/100ML-% IV SOLN
2.0000 g | INTRAVENOUS | Status: AC
  Administered 2023-03-26: 2 g via INTRAVENOUS
  Filled 2023-03-26: qty 100

## 2023-03-26 MED ORDER — LIDOCAINE 2% (20 MG/ML) 5 ML SYRINGE
INTRAMUSCULAR | Status: AC
  Filled 2023-03-26: qty 5

## 2023-03-26 MED ORDER — OXYCODONE HCL 5 MG/5ML PO SOLN: 5.0000 mg | Freq: Once | ORAL | Status: AC | PRN

## 2023-03-26 MED ORDER — SCOPOLAMINE 1 MG/3DAYS TD PT72
1.0000 | MEDICATED_PATCH | TRANSDERMAL | Status: DC
  Administered 2023-03-26: 1.5 mg via TRANSDERMAL
  Filled 2023-03-26: qty 1

## 2023-03-26 MED ORDER — ROCURONIUM BROMIDE 100 MG/10ML IV SOLN
INTRAVENOUS | Status: DC | PRN
Start: 2023-03-26 — End: 2023-03-26
  Administered 2023-03-26: 60 mg via INTRAVENOUS

## 2023-03-26 MED ORDER — OXYCODONE HCL 5 MG PO TABS
ORAL_TABLET | ORAL | Status: AC
  Filled 2023-03-26: qty 1

## 2023-03-26 MED ORDER — LIDOCAINE 2% (20 MG/ML) 5 ML SYRINGE
INTRAMUSCULAR | Status: DC | PRN
  Administered 2023-03-26: 80 mg via INTRAVENOUS

## 2023-03-26 SURGICAL SUPPLY — 45 items
ADH SKN CLS APL DERMABOND .7 (GAUZE/BANDAGES/DRESSINGS) ×4
APL PRP STRL LF DISP 70% ISPRP (MISCELLANEOUS) ×2
BINDER BREAST XLRG (GAUZE/BANDAGES/DRESSINGS) IMPLANT
CANISTER SUCT 3000ML PPV (MISCELLANEOUS) ×3 IMPLANT
CHLORAPREP W/TINT 26 (MISCELLANEOUS) ×3 IMPLANT
CNTNR URN SCR LID CUP LEK RST (MISCELLANEOUS) IMPLANT
CONT SPEC 4OZ STRL OR WHT (MISCELLANEOUS) ×2
COVER SURGICAL LIGHT HANDLE (MISCELLANEOUS) ×3 IMPLANT
DERMABOND ADVANCED .7 DNX12 (GAUZE/BANDAGES/DRESSINGS) ×6 IMPLANT
DRAIN JP 15F RND TROCAR (DRAIN) IMPLANT
DRAPE HALF SHEET 40X57 (DRAPES) IMPLANT
DRAPE INCISE IOBAN 66X45 STRL (DRAPES) ×3 IMPLANT
DRAPE TOP ARMCOVERS (MISCELLANEOUS) ×3 IMPLANT
DRAPE U-SHAPE 76X120 STRL (DRAPES) ×3 IMPLANT
DRAPE UTILITY XL STRL (DRAPES) ×3 IMPLANT
DRSG TEGADERM 4X10 (GAUZE/BANDAGES/DRESSINGS) ×6 IMPLANT
DRSG TEGADERM 4X4.75 (GAUZE/BANDAGES/DRESSINGS) ×12 IMPLANT
ELECT BLADE 4.0 EZ CLEAN MEGAD (MISCELLANEOUS) ×2
ELECT CAUTERY BLADE 6.4 (BLADE) ×3 IMPLANT
ELECT COATED BLADE 2.86 ST (ELECTRODE) ×3 IMPLANT
ELECT REM PT RETURN 9FT ADLT (ELECTROSURGICAL) ×2
ELECTRODE BLDE 4.0 EZ CLN MEGD (MISCELLANEOUS) ×3 IMPLANT
ELECTRODE REM PT RTRN 9FT ADLT (ELECTROSURGICAL) ×3 IMPLANT
EVACUATOR SILICONE 100CC (DRAIN) IMPLANT
GAUZE PAD ABD 8X10 STRL (GAUZE/BANDAGES/DRESSINGS) ×6 IMPLANT
GLOVE BIO SURGEON STRL SZ 6 (GLOVE) ×9 IMPLANT
GOWN STRL REUS W/ TWL LRG LVL3 (GOWN DISPOSABLE) ×6 IMPLANT
GOWN STRL REUS W/TWL LRG LVL3 (GOWN DISPOSABLE) ×4
KIT BASIN OR (CUSTOM PROCEDURE TRAY) ×3 IMPLANT
KIT TURNOVER KIT B (KITS) ×3 IMPLANT
MARKER SKIN DUAL TIP RULER LAB (MISCELLANEOUS) ×3 IMPLANT
NS IRRIG 1000ML POUR BTL (IV SOLUTION) ×6 IMPLANT
PACK GENERAL/GYN (CUSTOM PROCEDURE TRAY) ×3 IMPLANT
PAD ARMBOARD 7.5X6 YLW CONV (MISCELLANEOUS) ×3 IMPLANT
PIN SAFETY STERILE (MISCELLANEOUS) IMPLANT
SOL PREP POV-IOD 4OZ 10% (MISCELLANEOUS) ×3 IMPLANT
STAPLER VISISTAT 35W (STAPLE) ×3 IMPLANT
SUT ETHILON 2 0 FS 18 (SUTURE) ×3 IMPLANT
SUT MNCRL AB 4-0 PS2 18 (SUTURE) IMPLANT
SUT PDS AB 2-0 CT2 27 (SUTURE) IMPLANT
SUT VIC AB 3-0 PS1 18 (SUTURE) ×6
SUT VIC AB 3-0 PS1 18XBRD (SUTURE) IMPLANT
SYR BULB IRRIG 60ML STRL (SYRINGE) ×3 IMPLANT
TOWEL GREEN STERILE (TOWEL DISPOSABLE) ×3 IMPLANT
TOWEL GREEN STERILE FF (TOWEL DISPOSABLE) ×3 IMPLANT

## 2023-03-26 NOTE — Op Note (Signed)
Operative Note   DATE OF OPERATION: 10.4.2024  LOCATION: [facility]  SURGICAL DIVISION: Plastic Surgery  PREOPERATIVE DIAGNOSES:  1. History breast cancer 2. Acquired absence breasts 3. History breast reconstruction  POSTOPERATIVE DIAGNOSES:  same  PROCEDURE:  Removal bilateral breast implants  SURGEON: Glenna Fellows MD MBA  ASSISTANT: Harl Favor RNFA  ANESTHESIA:  General.   EBL: 17 ml  COMPLICATIONS: None immediate.   INDICATIONS FOR PROCEDURE:  The patient, Kathryn [name], is a 57 y.o. female born on 1966/01/01, is here for removal breast implants, reconstructed niple areola complex. Patient has a history bilateral mastectomies with implant based reconstruction.   FINDINGS: Removed intact smooth round silicone implants. Over right chest implant stamp read "Mentor 450 ml." over left chest, implant stamp read "Mentor M+ 450 ml." Thin capsules noted bilateral. Complete submuscular coverage of implants noted.  DESCRIPTION OF PROCEDURE:  The patient's operative site was marked with the patient in the preoperative area. The patient was taken to the operating room. SCDs were placed and IV antibiotics were given. The patient's operative site was prepped and draped in a sterile fashion. A time out was performed and all information was confirmed to be correct. Over left chest elliptical transversely oriented skin excision designed centered over prior mastectomy scar. Incision carried to pectoralis muscle surface. Muscle divided in direction of fibers. Intact silicone implant removed. Examination of capsule noted to be very thin. A small portion anterior capsule excised to aiid with adherence. Local anesthetic infiltrated. 15 Fr JP placed in submuscular position and secured with 2-0 nylon. Muscle sewn to chest wall with interrupted figure of eight 2-0 PDS sutures. Closure completed with interrupted and short running 3-0 vicryl in dermis followed by 4-0 monocryl subcuticular skin closure.  Over right chest, elliptical transversely oriented skin excision designed centered over prior mastectomy scar. Incision carried to pectoralis muscle surface. Muscle divided in direction of fibers. Intact silicone implant removed. Examination of capsule noted to be very thin. A small portion anterior capsule excised to aiid with adherence. Local anesthetic infiltrated. 15 Fr JP placed in submuscular position and secured with 2-0 nylon. Muscle sewn to chest wall with interrupted figure of eight 2-0 PDS sutures. Closure completed with interrupted and short running 3-0 vicryl in dermis followed by 4-0 monocryl subcuticular skin closure. Dermabond, dry dressing and breast binder applied.   The patient was allowed to wake from anesthesia, extubated and taken to the recovery room in satisfactory condition.   SPECIMENS: right and left mastectomy flap  DRAINS: 15 Fr JP in right and left chest  Glenna Fellows, MD [facility]astic [facility]construct[facility]  Office/ physician access line after hours [phone]

## 2023-03-26 NOTE — Progress Notes (Signed)
Pt reports she has gone through menopause. No pre-op UPT needed.   Viviano Simas, RN

## 2023-03-26 NOTE — Anesthesia Postprocedure Evaluation (Signed)
Anesthesia Post Note  Patient: Kathryn [name]  Procedure(s) Performed: REMOVAL BREAST IMPLANTS (Bilateral: Breast) CAPSULECTOMY (Left: Breast)     Patient location during evaluation: PACU Anesthesia Type: General Level of consciousness: sedated and patient cooperative Pain management: pain level controlled Vital Signs Assessment: post-procedure vital signs reviewed and stable Respiratory status: spontaneous breathing Cardiovascular status: stable Anesthetic complications: no   No notable events documented.  Last Vitals:  Vitals:   03/26/23 0915 03/26/23 0930  BP: 119/72 117/70  Pulse: (!) 58 (!) 57  Resp: 15 12  Temp:  36.4 C  SpO2: 96% 96%    Last Pain:  Vitals:   03/26/23 0930  TempSrc:   PainSc: 4                  Lewie Loron

## 2023-03-26 NOTE — Interval H&P Note (Signed)
History and Physical Interval Note:  03/26/2023 6:49 AM  Kathryn [name]  has presented today for surgery, with the diagnosis of History breast cancer acquired absence breasts.  The various methods of treatment have been discussed with the patient and family. After consideration of risks, benefits and other options for treatment, the patient has consented to  Procedure(s): REMOVAL BREAST IMPLANTS (Bilateral) CAPSULECTOMY (Bilateral) as a surgical intervention.  The patient's history has been reviewed, patient examined, no change in status, stable for surgery.  I have reviewed the patient's chart and labs.  Questions were answered to the patient's satisfaction.     Irean Hong Kathryn [name]

## 2023-03-26 NOTE — Transfer of Care (Signed)
Immediate Anesthesia Transfer of Care Note  Patient: Kathryn [name]  Procedure(s) Performed: REMOVAL BREAST IMPLANTS (Bilateral: Breast) CAPSULECTOMY (Left: Breast)  Patient Location: PACU  Anesthesia Type:General  Level of Consciousness: awake and alert   Airway & Oxygen Therapy: Patient Spontanous Breathing and Patient connected to nasal cannula oxygen  Post-op Assessment: Report given to RN and Post -op Vital signs reviewed and stable  Post vital signs: Reviewed and stable  Last Vitals:  Vitals Value Taken Time  BP 123/67 03/26/23 0854  Temp    Pulse 56 03/26/23 0856  Resp 13 03/26/23 0856  SpO2 98 % 03/26/23 0856  Vitals shown include unfiled device data.  Last Pain:  Vitals:   03/26/23 0628  TempSrc:   PainSc: 0-No pain         Complications: No notable events documented.

## 2023-03-26 NOTE — Anesthesia Procedure Notes (Signed)
Procedure Name: Intubation Date/Time: 03/26/2023 7:31 AM  Performed by: Caren Macadam, CRNAPre-anesthesia Checklist: Patient identified, Emergency Drugs available, Suction available and Patient being monitored Patient Re-evaluated:Patient Re-evaluated prior to induction Oxygen Delivery Method: Circle system utilized Preoxygenation: Pre-oxygenation with 100% oxygen Induction Type: IV induction Ventilation: Mask ventilation without difficulty Laryngoscope Size: Miller and 2 Grade View: Grade I Tube type: Oral Tube size: 7.0 mm Number of attempts: 1 Airway Equipment and Method: Stylet Placement Confirmation: ETT inserted through vocal cords under direct vision, positive ETCO2 and breath sounds checked- equal and bilateral Secured at: 22 cm Tube secured with: Tape Dental Injury: Teeth and Oropharynx as per pre-operative assessment

## 2023-03-27 ENCOUNTER — Encounter (HOSPITAL_COMMUNITY): Payer: Self-pay | Admitting: Plastic Surgery

## 2023-03-29 LAB — SURGICAL PATHOLOGY

## 2023-04-01 ENCOUNTER — Encounter: Payer: Self-pay | Admitting: Plastic Surgery

## 2023-04-23 ENCOUNTER — Encounter: Payer: 59 | Admitting: Family Medicine

## 2023-04-30 ENCOUNTER — Encounter: Payer: 59 | Admitting: Family Medicine

## 2023-05-18 ENCOUNTER — Encounter: Payer: Self-pay | Admitting: Family Medicine

## 2023-05-18 ENCOUNTER — Ambulatory Visit (INDEPENDENT_AMBULATORY_CARE_PROVIDER_SITE_OTHER): Payer: 59 | Admitting: Family Medicine

## 2023-05-18 VITALS — BP 120/70 | HR 55 | Temp 97.9°F | Ht 70.87 in | Wt 165.2 lb

## 2023-05-18 DIAGNOSIS — R5383 Other fatigue: Secondary | ICD-10-CM

## 2023-05-18 DIAGNOSIS — E785 Hyperlipidemia, unspecified: Secondary | ICD-10-CM | POA: Diagnosis not present

## 2023-05-18 DIAGNOSIS — Z23 Encounter for immunization: Secondary | ICD-10-CM | POA: Diagnosis not present

## 2023-05-18 DIAGNOSIS — Z0000 Encounter for general adult medical examination without abnormal findings: Secondary | ICD-10-CM

## 2023-05-18 LAB — CBC WITH DIFFERENTIAL/PLATELET
Basophils Absolute: 0 10*3/uL (ref 0.0–0.1)
Basophils Relative: 0.6 % (ref 0.0–3.0)
Eosinophils Absolute: 0 10*3/uL (ref 0.0–0.7)
Eosinophils Relative: 1 % (ref 0.0–5.0)
HCT: 42.2 % (ref 36.0–46.0)
Hemoglobin: 14 g/dL (ref 12.0–15.0)
Lymphocytes Relative: 40.1 % (ref 12.0–46.0)
Lymphs Abs: 1 10*3/uL (ref 0.7–4.0)
MCHC: 33.3 g/dL (ref 30.0–36.0)
MCV: 92.7 fL (ref 78.0–100.0)
Monocytes Absolute: 0.2 10*3/uL (ref 0.1–1.0)
Monocytes Relative: 7.4 % (ref 3.0–12.0)
Neutro Abs: 1.3 10*3/uL — ABNORMAL LOW (ref 1.4–7.7)
Neutrophils Relative %: 50.9 % (ref 43.0–77.0)
Platelets: 240 10*3/uL (ref 150.0–400.0)
RBC: 4.56 Mil/uL (ref 3.87–5.11)
RDW: 12.5 % (ref 11.5–15.5)
WBC: 2.6 10*3/uL — ABNORMAL LOW (ref 4.0–10.5)

## 2023-05-18 LAB — HEPATIC FUNCTION PANEL
ALT: 15 U/L (ref 0–35)
AST: 20 U/L (ref 0–37)
Albumin: 4.7 g/dL (ref 3.5–5.2)
Alkaline Phosphatase: 65 U/L (ref 39–117)
Bilirubin, Direct: 0.1 mg/dL (ref 0.0–0.3)
Total Bilirubin: 0.8 mg/dL (ref 0.2–1.2)
Total Protein: 6.7 g/dL (ref 6.0–8.3)

## 2023-05-18 LAB — LIPID PANEL
Cholesterol: 275 mg/dL — ABNORMAL HIGH (ref 0–200)
HDL: 49.2 mg/dL (ref >39.00)
LDL Cholesterol: 202 mg/dL — ABNORMAL HIGH (ref 0–99)
NonHDL: 225.62
Total CHOL/HDL Ratio: 6
Triglycerides: 116 mg/dL (ref 0.0–149.0)
VLDL: 23.2 mg/dL (ref 0.0–40.0)

## 2023-05-18 LAB — BASIC METABOLIC PANEL
BUN: 19 mg/dL (ref 6–23)
CO2: 30 meq/L (ref 19–32)
Calcium: 9.4 mg/dL (ref 8.4–10.5)
Chloride: 103 meq/L (ref 96–112)
Creatinine, Ser: 0.94 mg/dL (ref 0.40–1.20)
GFR: 66.98 mL/min (ref >60.00)
Glucose, Bld: 92 mg/dL (ref 70–99)
Potassium: 4 meq/L (ref 3.5–5.1)
Sodium: 140 meq/L (ref 135–145)

## 2023-05-18 LAB — TSH: TSH: 2.9 u[IU]/mL (ref 0.35–5.50)

## 2023-05-18 NOTE — Progress Notes (Signed)
Established Patient Office Visit  Subjective   Patient ID: Kathryn [name], female    DOB: 1965-07-04  Age: 57 y.o. MRN: [id number]  Chief Complaint  Patient presents with   Annual Exam    HPI   Kathryn [name] is seen today for physical exam.  She has history of breast cancer.  She has had bilateral mastectomies no longer gets mammograms.  She had breast implants removed recently.  She is very health-conscious.  She exercises several times per week.  Also works as a Psychologist, educational.  Also very conscious regarding diet.  Takes no regular prescription medications.  She does have history of high LDL cholesterol was 165 last year.  Does have questions regarding further restratification for CAD.  Health maintenance reviewed:  Health Maintenance  Topic Date Due   COVID-19 Vaccine (1) Never done   HIV Screening  Never done   Cervical Cancer Screening (HPV/Pap Cotest)  08/21/2018   INFLUENZA VACCINE  01/21/2023   Colonoscopy  03/11/2032   DTaP/Tdap/Td (4 - Td or Tdap) 04/21/2032   Hepatitis C Screening  Completed   Zoster Vaccines- Shingrix  Completed   HPV VACCINES  Aged Out   MAMMOGRAM  Discontinued   -Does request flu vaccine today -Sees GYN and Pap smears up-to-date  Family history and social history reviewed with no significant changes  Social History   Socioeconomic History   Marital status: Married    Spouse name: Not on file   Number of children: Not on file   Years of education: Not on file   Highest education level: Not on file  Occupational History   Occupation: Diplomatic Services operational officer: [employer]  Tobacco Use   Smoking status: Never   Smokeless tobacco: Never  Vaping Use   Vaping status: Never Used  Substance and Sexual Activity   Alcohol use: Yes    Alcohol/week: 0.0 standard drinks of alcohol    Comment: 1-2 week   Drug use: No   Sexual activity: Not Currently  Other Topics Concern   Not on file  Social History Narrative   Not on file   Social  Determinants of Health   Financial Resource Strain: Not on file  Food Insecurity: Not on file  Transportation Needs: Not on file  Physical Activity: Not on file  Stress: Not on file  Social Connections: Not on file  Intimate Partner Violence: Not on file   Family History  Problem Relation Age of Onset   Hyperlipidemia Mother    Hypertension Mother    Crohn's disease Mother    Cancer Mother        gastric cancer   Cancer Father    Deep vein thrombosis Sister    Aneurysm Maternal Aunt 40       brain   Breast cancer Paternal Aunt        dx in her late 24s   Colon cancer Paternal Uncle        dx and died in his 14s   Mental illness Paternal Uncle    Arthritis Other    Hyperlipidemia Other    Hypertension Other      Review of Systems  Constitutional:  Positive for malaise/fatigue. Negative for chills, fever and weight loss.  HENT:  Negative for hearing loss.   Eyes:  Negative for blurred vision and double vision.  Respiratory:  Negative for cough and shortness of breath.   Cardiovascular:  Negative for chest pain, palpitations and leg swelling.  Gastrointestinal:  Negative for abdominal pain, blood in stool, constipation and diarrhea.  Genitourinary:  Negative for dysuria.  Skin:  Negative for rash.  Neurological:  Negative for dizziness, speech change, seizures, loss of consciousness and headaches.  Psychiatric/Behavioral:  Negative for depression.       Objective:     BP 120/70 (BP Location: Left Arm, Patient Position: Sitting, Cuff Size: Normal)   Pulse (!) 55   Temp 97.9 F (36.6 C) (Oral)   Ht 5' 10.87" (1.8 m)   Wt 165 lb 3.2 oz (74.9 kg)   SpO2 98%   BMI 23.13 kg/m  BP Readings from Last 3 Encounters:  05/18/23 120/70  03/26/23 117/70  03/22/23 129/82   Wt Readings from Last 3 Encounters:  05/18/23 165 lb 3.2 oz (74.9 kg)  03/26/23 167 lb (75.8 kg)  03/22/23 166 lb 11.2 oz (75.6 kg)      Physical Exam Vitals reviewed.  Constitutional:       Appearance: She is well-developed.  HENT:     Head: Normocephalic and atraumatic.  Eyes:     Pupils: Pupils are equal, round, and reactive to light.  Neck:     Thyroid: No thyromegaly.  Cardiovascular:     Rate and Rhythm: Normal rate and regular rhythm.     Heart sounds: Normal heart sounds. No murmur heard. Pulmonary:     Effort: No respiratory distress.     Breath sounds: Normal breath sounds. No wheezing or rales.  Abdominal:     General: Bowel sounds are normal. There is no distension.     Palpations: Abdomen is soft. There is no mass.     Tenderness: There is no abdominal tenderness. There is no guarding or rebound.  Musculoskeletal:        General: Normal range of motion.     Cervical back: Normal range of motion and neck supple.  Lymphadenopathy:     Cervical: No cervical adenopathy.  Skin:    Findings: No rash.  Neurological:     Mental Status: She is alert and oriented to person, place, and time.     Cranial Nerves: No cranial nerve deficit.  Psychiatric:        Behavior: Behavior normal.        Thought Content: Thought content normal.        Judgment: Judgment normal.      No results found for any visits on 05/18/23.    The 10-year ASCVD risk score (Arnett DK, et al., 2019) is: 3%    Assessment & Plan:   Problem List Items Addressed This Visit   None Visit Diagnoses     Physical exam    -  Primary   Relevant Orders   Lipid panel   Basic metabolic panel   CBC with Differential/Platelet   Hepatic function panel   TSH   Hyperlipidemia, unspecified hyperlipidemia type       Relevant Orders   CT CARDIAC SCORING (SELF PAY ONLY)   Fatigue, unspecified type       Relevant Orders   TSH     Here today for physical exam.  She still sees GYN.  No indication for mammograms with bilateral mastectomy history.  Pap smear is up-to-date.  Colonoscopy up-to-date.  Flu vaccine given.  Shingles vaccine complete.  -She does have history of hyperlipidemia we  discussed further restratification with coronary calcium scan and she would like to proceed with that.  This was ordered. -Continue low saturated fat diet and  regular exercise -Would consider repeat DEXA at 64.  She has had 1 DEXA already through GYN which was normal. -Colonoscopy due 2033  No follow-ups on file.    Evelena Peat, MD

## 2023-05-18 NOTE — Addendum Note (Signed)
Addended by: Christy Sartorius on: 05/18/2023 08:43 AM   Modules accepted: Orders

## 2023-05-27 ENCOUNTER — Ambulatory Visit (HOSPITAL_BASED_OUTPATIENT_CLINIC_OR_DEPARTMENT_OTHER)
Admission: RE | Admit: 2023-05-27 | Discharge: 2023-05-27 | Disposition: A | Discharge status: Home / Self Care | Payer: 59 | Source: Ambulatory Visit | Attending: Family Medicine | Admitting: Family Medicine

## 2023-05-27 DIAGNOSIS — E785 Hyperlipidemia, unspecified: Secondary | ICD-10-CM | POA: Insufficient documentation

## 2023-06-21 ENCOUNTER — Encounter: Payer: Self-pay | Admitting: Family Medicine

## 2023-06-21 ENCOUNTER — Ambulatory Visit (INDEPENDENT_AMBULATORY_CARE_PROVIDER_SITE_OTHER): Payer: 59 | Admitting: Family Medicine

## 2023-06-21 VITALS — BP 112/68 | HR 60 | Temp 98.2°F | Wt 168.2 lb

## 2023-06-21 DIAGNOSIS — D72819 Decreased white blood cell count, unspecified: Secondary | ICD-10-CM | POA: Diagnosis not present

## 2023-06-21 LAB — CBC WITH DIFFERENTIAL/PLATELET
Basophils Absolute: 0 10*3/uL (ref 0.0–0.1)
Basophils Relative: 0.5 % (ref 0.0–3.0)
Eosinophils Absolute: 0 10*3/uL (ref 0.0–0.7)
Eosinophils Relative: 0.7 % (ref 0.0–5.0)
HCT: 41.8 % (ref 36.0–46.0)
Hemoglobin: 14 g/dL (ref 12.0–15.0)
Lymphocytes Relative: 32.8 % (ref 12.0–46.0)
Lymphs Abs: 1.5 10*3/uL (ref 0.7–4.0)
MCHC: 33.6 g/dL (ref 30.0–36.0)
MCV: 92.1 fL (ref 78.0–100.0)
Monocytes Absolute: 0.3 10*3/uL (ref 0.1–1.0)
Monocytes Relative: 6.6 % (ref 3.0–12.0)
Neutro Abs: 2.7 10*3/uL (ref 1.4–7.7)
Neutrophils Relative %: 59.4 % (ref 43.0–77.0)
Platelets: 248 10*3/uL (ref 150.0–400.0)
RBC: 4.54 Mil/uL (ref 3.87–5.11)
RDW: 12.5 % (ref 11.5–15.5)
WBC: 4.6 10*3/uL (ref 4.0–10.5)

## 2023-06-21 NOTE — Progress Notes (Signed)
Established Patient Office Visit  Subjective   Patient ID: Kathryn [name], female    DOB: 04-19-1966  Age: 57 y.o. MRN: [id number]  Chief Complaint  Patient presents with   Medical Management of Chronic Issues    HPI   Romie had recent physical exam.  She had total cholesterol 275 with LDL 202.  Coronary calcium score fortunately came back 0.  She does not wish to consider statin therapy.  She also had white count of 2.6 thousand.  This has been slightly low before.  No recent bacterial infections.  She had breast implant removed back in the fall.  No recent pneumonia, UTI, cellulitis, or other bacterial infections.  Infrequent alcohol use.  Past Medical History:  Diagnosis Date   Cancer (HCC) 10/25/2008   Family history of breast cancer    Family history of colon cancer    Family history of stomach cancer    HYPERLIPIDEMIA 10/25/2008   Past Surgical History:  Procedure Laterality Date   APPENDECTOMY  2003   BREAST IMPLANT REMOVAL Bilateral 03/26/2023   Procedure: REMOVAL BREAST IMPLANTS;  Surgeon: Glenna Fellows, MD;  Location: [facility] OR;  Service: Plastics;  Laterality: Bilateral;   CAPSULECTOMY Left 03/26/2023   Procedure: CAPSULECTOMY;  Surgeon: Glenna Fellows, MD;  Location: [facility] OR;  Service: Plastics;  Laterality: Left;   CESAREAN SECTION  2004   MASTECTOMY Bilateral 2007 and 2009   TONSILLECTOMY  1974    reports that she has never smoked. She has never used smokeless tobacco. She reports current alcohol use. She reports that she does not use drugs. family history includes Aneurysm (age of onset: 63) in her maternal aunt; Arthritis in an other family member; Breast cancer in her paternal aunt; Cancer in her father and mother; Colon cancer in her paternal uncle; Crohn's disease in her mother; Deep vein thrombosis in her sister; Hyperlipidemia in her mother and another family member; Hypertension in her mother and another family member; Mental illness in her paternal  uncle. Allergies  Allergen Reactions   Latex     Review of Systems  Constitutional:  Negative for chills and fever.  Respiratory:  Negative for cough.       Objective:     BP 112/68 (BP Location: Left Arm, Patient Position: Sitting, Cuff Size: Normal)   Pulse 60   Temp 98.2 F (36.8 C) (Oral)   Wt 168 lb 3.2 oz (76.3 kg)   SpO2 99%   BMI 23.55 kg/m  BP Readings from Last 3 Encounters:  06/21/23 112/68  05/18/23 120/70  03/26/23 117/70   Wt Readings from Last 3 Encounters:  06/21/23 168 lb 3.2 oz (76.3 kg)  05/18/23 165 lb 3.2 oz (74.9 kg)  03/26/23 167 lb (75.8 kg)      Physical Exam Vitals reviewed.  Constitutional:      Appearance: Normal appearance.  Cardiovascular:     Rate and Rhythm: Normal rate and regular rhythm.     Heart sounds: No murmur heard. Neurological:     Mental Status: She is alert.      No results found for any visits on 06/21/23.    The 10-year ASCVD risk score (Arnett DK, et al., 2019) is: 3%    Assessment & Plan:   Problem List Items Addressed This Visit   None Visit Diagnoses       Leukopenia, unspecified type    -  Primary   Relevant Orders   CBC with Differential/Platelet     Recent mild  leukopenia with total white count 2.6 thousand with normal differential.  Doubt clinically significant.  Will recheck CBC to be sure  No follow-ups on file.    Evelena Peat, MD
